# Patient Record
Sex: Female | Born: 1990 | Race: White | Hispanic: No | Marital: Single | Smoking: Current every day smoker
Health system: Southern US, Community
[De-identification: ages and names within clinical notes are randomized; demographics above are authoritative.]

## PROBLEM LIST (undated history)

## (undated) ENCOUNTER — Inpatient Hospital Stay: Payer: Self-pay

## (undated) DIAGNOSIS — G43909 Migraine, unspecified, not intractable, without status migrainosus: Secondary | ICD-10-CM

## (undated) HISTORY — DX: Migraine, unspecified, not intractable, without status migrainosus: G43.909

---
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
## 2010-07-30 ENCOUNTER — Observation Stay: Payer: Self-pay | Admitting: Obstetrics and Gynecology

## 2010-08-05 ENCOUNTER — Observation Stay: Payer: Self-pay | Admitting: Obstetrics and Gynecology

## 2010-08-08 ENCOUNTER — Inpatient Hospital Stay: Payer: Self-pay

## 2013-04-26 ENCOUNTER — Emergency Department: Payer: Self-pay | Admitting: Emergency Medicine

## 2013-05-29 ENCOUNTER — Emergency Department: Payer: Self-pay | Admitting: Emergency Medicine

## 2013-07-17 ENCOUNTER — Emergency Department: Payer: Self-pay | Admitting: Emergency Medicine

## 2013-07-17 LAB — RAPID INFLUENZA A&B ANTIGENS

## 2013-08-24 ENCOUNTER — Emergency Department: Payer: Self-pay | Admitting: Emergency Medicine

## 2013-08-24 LAB — CBC
HCT: 38.9 % (ref 35.0–47.0)
HGB: 13.3 g/dL (ref 12.0–16.0)
MCH: 30.2 pg (ref 26.0–34.0)
MCHC: 34.1 g/dL (ref 32.0–36.0)
MCV: 89 fL (ref 80–100)
Platelet: 225 10*3/uL (ref 150–440)
RBC: 4.39 10*6/uL (ref 3.80–5.20)
RDW: 15.3 % — ABNORMAL HIGH (ref 11.5–14.5)
WBC: 4.9 10*3/uL (ref 3.6–11.0)

## 2013-08-24 LAB — COMPREHENSIVE METABOLIC PANEL
ALBUMIN: 3.9 g/dL (ref 3.4–5.0)
ALT: 16 U/L (ref 12–78)
Alkaline Phosphatase: 74 U/L
Anion Gap: 3 — ABNORMAL LOW (ref 7–16)
BUN: 13 mg/dL (ref 7–18)
Bilirubin,Total: 0.3 mg/dL (ref 0.2–1.0)
CHLORIDE: 103 mmol/L (ref 98–107)
CO2: 28 mmol/L (ref 21–32)
CREATININE: 0.75 mg/dL (ref 0.60–1.30)
Calcium, Total: 8.8 mg/dL (ref 8.5–10.1)
Glucose: 94 mg/dL (ref 65–99)
OSMOLALITY: 268 (ref 275–301)
POTASSIUM: 3.4 mmol/L — AB (ref 3.5–5.1)
SGOT(AST): 25 U/L (ref 15–37)
Sodium: 134 mmol/L — ABNORMAL LOW (ref 136–145)
Total Protein: 8 g/dL (ref 6.4–8.2)

## 2013-08-24 LAB — GC/CHLAMYDIA PROBE AMP

## 2013-08-24 LAB — URINALYSIS, COMPLETE
BILIRUBIN, UR: NEGATIVE
Bacteria: NONE SEEN
Blood: NEGATIVE
Glucose,UR: NEGATIVE mg/dL (ref 0–75)
Ketone: NEGATIVE
NITRITE: NEGATIVE
Ph: 5 (ref 4.5–8.0)
Protein: NEGATIVE
SPECIFIC GRAVITY: 1.012 (ref 1.003–1.030)
WBC UR: 2 /HPF (ref 0–5)

## 2013-08-24 LAB — WET PREP, GENITAL

## 2013-08-24 LAB — PREGNANCY, URINE: Pregnancy Test, Urine: NEGATIVE m[IU]/mL

## 2013-11-29 ENCOUNTER — Emergency Department: Payer: Self-pay | Admitting: Emergency Medicine

## 2013-11-29 LAB — URINALYSIS, COMPLETE
Bacteria: NONE SEEN
Bilirubin,UR: NEGATIVE
Glucose,UR: NEGATIVE mg/dL (ref 0–75)
Nitrite: NEGATIVE
Ph: 5 (ref 4.5–8.0)
Protein: NEGATIVE
Specific Gravity: 1.017 (ref 1.003–1.030)
Squamous Epithelial: 14
WBC UR: 4 /HPF (ref 0–5)

## 2013-11-29 LAB — HCG, QUANTITATIVE, PREGNANCY: Beta Hcg, Quant.: 1142 m[IU]/mL — ABNORMAL HIGH

## 2013-11-29 LAB — MONONUCLEOSIS SCREEN: Mono Test: POSITIVE

## 2013-11-30 ENCOUNTER — Emergency Department: Payer: Self-pay | Admitting: Emergency Medicine

## 2013-11-30 LAB — HCG, QUANTITATIVE, PREGNANCY: Beta Hcg, Quant.: 1421 m[IU]/mL — ABNORMAL HIGH

## 2013-12-03 LAB — BETA STREP CULTURE(ARMC)

## 2013-12-08 ENCOUNTER — Emergency Department: Payer: Self-pay | Admitting: Emergency Medicine

## 2013-12-08 LAB — HCG, QUANTITATIVE, PREGNANCY: Beta Hcg, Quant.: 3000 m[IU]/mL — ABNORMAL HIGH

## 2013-12-08 LAB — CBC
HCT: 35.5 % (ref 35.0–47.0)
HGB: 12 g/dL (ref 12.0–16.0)
MCH: 29.2 pg (ref 26.0–34.0)
MCHC: 33.7 g/dL (ref 32.0–36.0)
MCV: 87 fL (ref 80–100)
PLATELETS: 327 10*3/uL (ref 150–440)
RBC: 4.09 10*6/uL (ref 3.80–5.20)
RDW: 14.7 % — ABNORMAL HIGH (ref 11.5–14.5)
WBC: 8 10*3/uL (ref 3.6–11.0)

## 2013-12-18 DIAGNOSIS — O039 Complete or unspecified spontaneous abortion without complication: Secondary | ICD-10-CM

## 2013-12-18 HISTORY — DX: Complete or unspecified spontaneous abortion without complication: O03.9

## 2014-06-05 ENCOUNTER — Emergency Department: Payer: Self-pay | Admitting: Emergency Medicine

## 2014-07-20 NOTE — L&D Delivery Note (Signed)
Obstetrical Delivery Note   Date of Delivery:   12/14/2014 Primary OB:   [facility] Gestational Age/EDD: [id number]  Antepartum complications: none  Delivered By:   Cornelia Copaharlie Kahla Risdon, Jr MD  Delivery Type:   spontaneous vaginal delivery  Procedure Details:   CTSP after epidural placed and feeling pressure. Complete/+2 and DOA. Over several UCs and pushes, patient delivered vigorous infant w/o complicated. Delayed CC and cord cut by FOB. Placenta out via active third stage (intact) and mom and baby doing well Anesthesia:    epidural Intrapartum complications: None GBS:    Negative Laceration:    none Episiotomy:    none Placenta:    Via active 3rd stage. To pathology: no Estimated Blood Loss:  200mL  Baby:    Liveborn female, Apgars 8/8, weight 3250gm  Cornelia Copaharlie Denee Boeder, Jr. MD [facility] Pager [phone]

## 2014-09-26 LAB — OB RESULTS CONSOLE RPR: RPR: NONREACTIVE

## 2014-09-26 LAB — OB RESULTS CONSOLE GC/CHLAMYDIA
CHLAMYDIA, DNA PROBE: NEGATIVE
Gonorrhea: NEGATIVE

## 2014-09-26 LAB — OB RESULTS CONSOLE ABO/RH: RH TYPE: POSITIVE

## 2014-09-26 LAB — OB RESULTS CONSOLE HIV ANTIBODY (ROUTINE TESTING): HIV: NONREACTIVE

## 2014-09-26 LAB — OB RESULTS CONSOLE ANTIBODY SCREEN: ANTIBODY SCREEN: NEGATIVE

## 2014-12-03 ENCOUNTER — Observation Stay
Admission: EM | Admit: 2014-12-03 | Discharge: 2014-12-03 | Disposition: A | Discharge status: Home / Self Care | Payer: Medicaid Other | Attending: Certified Nurse Midwife | Admitting: Certified Nurse Midwife

## 2014-12-03 DIAGNOSIS — Z3A38 38 weeks gestation of pregnancy: Secondary | ICD-10-CM | POA: Insufficient documentation

## 2014-12-03 DIAGNOSIS — G43909 Migraine, unspecified, not intractable, without status migrainosus: Secondary | ICD-10-CM | POA: Insufficient documentation

## 2014-12-03 DIAGNOSIS — Z87891 Personal history of nicotine dependence: Secondary | ICD-10-CM | POA: Diagnosis not present

## 2014-12-03 DIAGNOSIS — R102 Pelvic and perineal pain: Secondary | ICD-10-CM | POA: Diagnosis present

## 2014-12-03 DIAGNOSIS — O26893 Other specified pregnancy related conditions, third trimester: Principal | ICD-10-CM | POA: Insufficient documentation

## 2014-12-03 DIAGNOSIS — Z3490 Encounter for supervision of normal pregnancy, unspecified, unspecified trimester: Secondary | ICD-10-CM

## 2014-12-03 NOTE — Discharge Instructions (Signed)

## 2014-12-03 NOTE — OB Triage Note (Signed)
Patient with complaint of vaginal discharge and pressure, denies bleeding and contractions

## 2014-12-03 NOTE — Progress Notes (Signed)
24 year old G4 P2012 with EDC=12/16/2014 by LMP=03/11/2014 and c/w a 13 wk 4day ultrasound  presents at 3938 1/7 weeks with c/c of pelvic pressure and increased vaginal discharge since yesterday. Baby active. Not feeling any regular ctxs. No VB or LOF. Denies vulvovaginal itching or irritation. [facility]ra[facility]. Quit smoking when she had +UPT.  O POS, RI, VI, GBS negative  O: BP 118/66 mmHg  Pulse 81  Temp(Src) 98.3 F (36.8 C) (Oral)  Resp 16  Ht 5\' 2"  (1.575 m)  Wt 68.04 kg (150 lb)  BMI 27.43 kg/m2  LMP 03/11/2014   Abdomen: contraction palpated after performing  Leopold's. Cephalic. EFW=6 1/2# FHR 130 baseline with accels to 160s to 170s Some uterine activity (q3-12 min aprt).  Mostly uterine irritability EXT : vestibule appears red Vagina; white discharge present Wet prep: negative for clue cells, Trich, hyphae Cervix: 1/50%/-1  A: IUP at 38 1/7 weeks with no evidence of labor. Pressure due to low station of fetus Normal vaginal discharge  P: DC home Labor precautions  Dastan Krider, CNM

## 2014-12-13 ENCOUNTER — Observation Stay
Admission: EM | Admit: 2014-12-13 | Discharge: 2014-12-14 | Disposition: A | Discharge status: Home / Self Care | Payer: Medicaid Other | Source: Home / Self Care | Admitting: Obstetrics and Gynecology

## 2014-12-13 MED ORDER — LACTATED RINGERS IV SOLN
500.0000 mL | INTRAVENOUS | Status: DC | PRN
Start: 2014-12-13 — End: 2014-12-14

## 2014-12-13 NOTE — OB Triage Note (Signed)
Patient presents to L&D c/o contractions that began at 1400 and are now 10 minutes apart.  States she has lost mucous plug.  Denies bleeding, SROM or decreased fetal movement.  States membranes [facility] "stri[facility]" [facility]

## 2014-12-14 ENCOUNTER — Inpatient Hospital Stay: Payer: Medicaid Other | Admitting: Anesthesiology

## 2014-12-14 ENCOUNTER — Telehealth: Payer: Self-pay

## 2014-12-14 ENCOUNTER — Inpatient Hospital Stay
Admission: EM | Admit: 2014-12-14 | Discharge: 2014-12-16 | DRG: 775 | Disposition: A | Discharge status: Home / Self Care | Payer: Medicaid Other | Source: Ambulatory Visit | Attending: Obstetrics & Gynecology | Admitting: Obstetrics & Gynecology

## 2014-12-14 ENCOUNTER — Emergency Department
Admission: EM | Admit: 2014-12-14 | Discharge: 2014-12-14 | Discharge status: Absent without leave | Payer: Medicaid Other

## 2014-12-14 DIAGNOSIS — IMO0001 Reserved for inherently not codable concepts without codable children: Secondary | ICD-10-CM

## 2014-12-14 DIAGNOSIS — Z87891 Personal history of nicotine dependence: Secondary | ICD-10-CM | POA: Diagnosis not present

## 2014-12-14 DIAGNOSIS — Z3A39 39 weeks gestation of pregnancy: Secondary | ICD-10-CM | POA: Diagnosis present

## 2014-12-14 LAB — CBC
HCT: 29.7 % — ABNORMAL LOW (ref 35.0–47.0)
HEMOGLOBIN: 9.6 g/dL — AB (ref 12.0–16.0)
MCH: 25 pg — ABNORMAL LOW (ref 26.0–34.0)
MCHC: 32.4 g/dL (ref 32.0–36.0)
MCV: 77 fL — AB (ref 80.0–100.0)
Platelets: 175 10*3/uL (ref 150–440)
RBC: 3.86 MIL/uL (ref 3.80–5.20)
RDW: 17 % — ABNORMAL HIGH (ref 11.5–14.5)
WBC: 14 10*3/uL — AB (ref 3.6–11.0)

## 2014-12-14 MED ORDER — BUTORPHANOL TARTRATE 1 MG/ML IJ SOLN
INTRAMUSCULAR | Status: AC
  Filled 2014-12-14: qty 2

## 2014-12-14 MED ORDER — BUTORPHANOL TARTRATE 1 MG/ML IJ SOLN
1.0000 mg | INTRAMUSCULAR | Status: DC | PRN
  Administered 2014-12-14: 1 mg via INTRAVENOUS

## 2014-12-14 MED ORDER — LIDOCAINE-EPINEPHRINE (PF) 1.5 %-1:200000 IJ SOLN
INTRAMUSCULAR | Status: DC | PRN
  Administered 2014-12-14: 2 mL via PERINEURAL

## 2014-12-14 MED ORDER — CITRIC ACID-SODIUM CITRATE 334-500 MG/5ML PO SOLN: 30.0000 mL | ORAL | Status: DC | PRN

## 2014-12-14 MED ORDER — ACETAMINOPHEN 325 MG PO TABS: 650.0000 mg | ORAL_TABLET | ORAL | Status: DC | PRN

## 2014-12-14 MED ORDER — LACTATED RINGERS IV SOLN
INTRAVENOUS | Status: DC
  Administered 2014-12-14: 17:00:00 via INTRAVENOUS

## 2014-12-14 MED ORDER — AMMONIA AROMATIC IN INHA
RESPIRATORY_TRACT | Status: AC
  Filled 2014-12-14: qty 10

## 2014-12-14 MED ORDER — DIBUCAINE 1 % RE OINT: 1.0000 "application " | TOPICAL_OINTMENT | RECTAL | Status: DC | PRN

## 2014-12-14 MED ORDER — LANOLIN HYDROUS EX OINT: TOPICAL_OINTMENT | CUTANEOUS | Status: DC | PRN

## 2014-12-14 MED ORDER — LACTATED RINGERS IV SOLN: 500.0000 mL | INTRAVENOUS | Status: DC | PRN

## 2014-12-14 MED ORDER — OXYTOCIN 40 UNITS IN LACTATED RINGERS INFUSION - SIMPLE MED
INTRAVENOUS | Status: AC
  Filled 2014-12-14: qty 1000

## 2014-12-14 MED ORDER — SIMETHICONE 80 MG PO CHEW
80.0000 mg | CHEWABLE_TABLET | ORAL | Status: DC | PRN
  Filled 2014-12-14: qty 1

## 2014-12-14 MED ORDER — BUPIVACAINE HCL (PF) 0.25 % IJ SOLN
INTRAMUSCULAR | Status: DC | PRN
  Administered 2014-12-14: 2 mL via PERINEURAL
  Administered 2014-12-14: 5 mL via PERINEURAL

## 2014-12-14 MED ORDER — BUTORPHANOL TARTRATE 1 MG/ML IJ SOLN
INTRAMUSCULAR | Status: AC
  Administered 2014-12-14: 1 mg via INTRAVENOUS
  Filled 2014-12-14: qty 2

## 2014-12-14 MED ORDER — BENZOCAINE-MENTHOL 20-0.5 % EX AERO: 1.0000 "application " | INHALATION_SPRAY | CUTANEOUS | Status: DC | PRN

## 2014-12-14 MED ORDER — ZOLPIDEM TARTRATE 5 MG PO TABS: 5.0000 mg | ORAL_TABLET | Freq: Every evening | ORAL | Status: DC | PRN

## 2014-12-14 MED ORDER — MAGNESIUM HYDROXIDE 400 MG/5ML PO SUSP: 30.0000 mL | ORAL | Status: DC | PRN

## 2014-12-14 MED ORDER — FENTANYL CITRATE (PF) 100 MCG/2ML IJ SOLN: 50.0000 ug | INTRAMUSCULAR | Status: DC | PRN

## 2014-12-14 MED ORDER — PRENATAL MULTIVITAMIN CH
1.0000 | ORAL_TABLET | Freq: Every day | ORAL | Status: DC
  Administered 2014-12-15 – 2014-12-16 (×2): 1 via ORAL
  Filled 2014-12-14 (×3): qty 1

## 2014-12-14 MED ORDER — MISOPROSTOL 200 MCG PO TABS
ORAL_TABLET | ORAL | Status: AC
  Filled 2014-12-14: qty 4

## 2014-12-14 MED ORDER — FENTANYL 2.5 MCG/ML W/ROPIVACAINE 0.2% IN NS 100 ML EPIDURAL INFUSION (ARMC-ANES)
EPIDURAL | Status: DC
Start: 2014-12-14 — End: 2014-12-14
  Filled 2014-12-14: qty 100

## 2014-12-14 MED ORDER — WITCH HAZEL-GLYCERIN EX PADS: 1.0000 "application " | MEDICATED_PAD | CUTANEOUS | Status: DC | PRN

## 2014-12-14 MED ORDER — OXYCODONE-ACETAMINOPHEN 5-325 MG PO TABS: 1.0000 | ORAL_TABLET | ORAL | Status: DC | PRN

## 2014-12-14 MED ORDER — ONDANSETRON HCL 4 MG PO TABS
4.0000 mg | ORAL_TABLET | ORAL | Status: DC | PRN
  Filled 2014-12-14: qty 1

## 2014-12-14 MED ORDER — SODIUM CHLORIDE 0.9 % IV SOLN: 250.0000 mL | INTRAVENOUS | Status: DC | PRN

## 2014-12-14 MED ORDER — SODIUM CHLORIDE 0.9 % IJ SOLN: 3.0000 mL | Freq: Two times a day (BID) | INTRAMUSCULAR | Status: DC

## 2014-12-14 MED ORDER — LIDOCAINE HCL (PF) 1 % IJ SOLN
30.0000 mL | INTRAMUSCULAR | Status: DC | PRN
  Filled 2014-12-14: qty 30

## 2014-12-14 MED ORDER — BUTORPHANOL TARTRATE 1 MG/ML IJ SOLN
2.0000 mg | Freq: Once | INTRAMUSCULAR | Status: AC
  Administered 2014-12-14: 2 mg via INTRAVENOUS

## 2014-12-14 MED ORDER — OXYTOCIN BOLUS FROM INFUSION: 500.0000 mL | INTRAVENOUS | Status: DC

## 2014-12-14 MED ORDER — OXYTOCIN 40 UNITS IN LACTATED RINGERS INFUSION - SIMPLE MED: 62.5000 mL/h | INTRAVENOUS | Status: DC

## 2014-12-14 MED ORDER — LIDOCAINE HCL (PF) 1 % IJ SOLN
INTRAMUSCULAR | Status: AC
  Filled 2014-12-14: qty 30

## 2014-12-14 MED ORDER — IBUPROFEN 600 MG PO TABS
600.0000 mg | ORAL_TABLET | Freq: Four times a day (QID) | ORAL | Status: DC
  Administered 2014-12-15: 600 mg via ORAL
  Filled 2014-12-14 (×3): qty 1

## 2014-12-14 MED ORDER — ONDANSETRON HCL 4 MG/2ML IJ SOLN: 4.0000 mg | INTRAMUSCULAR | Status: DC | PRN

## 2014-12-14 MED ORDER — OXYTOCIN 10 UNIT/ML IJ SOLN
INTRAMUSCULAR | Status: AC
  Filled 2014-12-14: qty 2

## 2014-12-14 MED ORDER — LACTATED RINGERS IV SOLN: INTRAVENOUS | Status: DC

## 2014-12-14 MED ORDER — DIPHENHYDRAMINE HCL 25 MG PO CAPS: 25.0000 mg | ORAL_CAPSULE | Freq: Four times a day (QID) | ORAL | Status: DC | PRN

## 2014-12-14 MED ORDER — DOCUSATE SODIUM 100 MG PO CAPS
100.0000 mg | ORAL_CAPSULE | Freq: Two times a day (BID) | ORAL | Status: DC
  Administered 2014-12-15 – 2014-12-16 (×2): 100 mg via ORAL
  Filled 2014-12-14 (×2): qty 1

## 2014-12-14 MED ORDER — SODIUM CHLORIDE 0.9 % IJ SOLN: 3.0000 mL | INTRAMUSCULAR | Status: DC | PRN

## 2014-12-14 MED ORDER — ONDANSETRON HCL 4 MG/2ML IJ SOLN: 4.0000 mg | Freq: Four times a day (QID) | INTRAMUSCULAR | Status: DC | PRN

## 2014-12-14 NOTE — Anesthesia Procedure Notes (Signed)
Epidural Patient location during procedure: OB Start time: 12/14/2014 6:45 PM  Staffing Anesthesiologist: Elijio MilesVAN STAVEREN, Lorain Fettes F Performed by: anesthesiologist   Preanesthetic Checklist Completed: patient identified, site marked, surgical consent, pre-op evaluation, timeout performed, IV checked, risks and benefits discussed, monitors and equipment checked and at surgeon's request  Epidural Patient position: sitting Prep: Betadine Patient monitoring: heart rate and blood pressure Approach: midline Location: L3-L4 Injection technique: LOR air and LOR saline  Needle:  Needle type: Tuohy  Needle gauge: 18 G Needle length: 9 cm Needle insertion depth: 5 cm Catheter type: closed end flexible Catheter size: 20 Guage Catheter at skin depth: 9 cm Test dose: negative and 1.5% lidocaine with Epi 1:200 K  Assessment Sensory level: T10  Additional Notes Reason for block:at surgeon's request

## 2014-12-14 NOTE — Anesthesia Preprocedure Evaluation (Signed)
Anesthesia Evaluation  Patient identified by MRN, date of birth, ID band Patient awake    Reviewed: Allergy & Precautions, NPO status   History of Anesthesia Complications Negative for: history of anesthetic complications  Airway Mallampati: II       Dental   Pulmonary former smoker,    Pulmonary exam normal       Cardiovascular negative cardio ROS Normal cardiovascular exam    Neuro/Psych negative neurological ROS  negative psych ROS   GI/Hepatic negative GI ROS, Neg liver ROS,   Endo/Other  negative endocrine ROS  Renal/GU negative Renal ROS     Musculoskeletal negative musculoskeletal ROS (+)   Abdominal Normal abdominal exam  (+)   Peds negative pediatric ROS (+)  Hematology negative hematology ROS (+)   Anesthesia Other Findings   Reproductive/Obstetrics                             Anesthesia Physical Anesthesia Plan  ASA: II  Anesthesia Plan: Epidural   Post-op Pain Management:    Induction:   Airway Management Planned: Natural Airway  Additional Equipment:   Intra-op Plan:   Post-operative Plan:   Informed Consent: I have reviewed the patients History and Physical, chart, labs and discussed the procedure including the risks, benefits and alternatives for the proposed anesthesia with the patient or authorized representative who has indicated his/her understanding and acceptance.     Plan Discussed with:   Anesthesia Plan Comments:         Anesthesia Quick Evaluation

## 2014-12-14 NOTE — Discharge Summary (Signed)
Obstetrical Discharge Summary  Date of Admission: 12/14/2014 Date of Discharge: 12/16/2014  Primary OB: [facility]  Gestational Age at Delivery: 7029w5d   Antepartum complications: none Reason for Admission: Active labor Date of Delivery: 12/14/2014  Delivered By: Cornelia Copaharlie Pickens, Jr MD Delivery Type: spontaneous vaginal delivery Intrapartum complications/course: None Anesthesia: epidural Placenta: expressed, intact Laceration: none Episiotomy: none Baby: Liveborn female  Post partum course: Routine Disposition: Home  Rh Immune globulin given: not applicable Rubella vaccine given: not applicable Tdap vaccine given in AP or PP setting: given in pregnancy Flu vaccine given in AP or PP setting: not applicable  Contraception: Plans IUD  Prenatal/Postnatal Panel: O POS//Rubella Immune//RPR negative//HIV negative/HepB Surface Ag negative//pap no abnormalities //plans to breastfeed  Plan:  Katherine Fitzgerald was discharged to home in good condition. Follow-up appointment with [facility] in 6 weeks   Discharge Medications:   Medication List    TAKE these medications        ibuprofen 600 MG tablet  Commonly known as:  ADVIL,MOTRIN  Take 1 tablet (600 mg total) by mouth every 6 (six) hours.     PRENATAL VITAMIN PO  Take 1 tablet by mouth every morning.        [facility]

## 2014-12-14 NOTE — H&P (Signed)
Katherine Fitzgerald is a 24 y.o. female [phone] @ 3039.4 presenting for labor at term. History OB History    Gravida Para Term Preterm AB TAB SAB Ectopic Multiple Living   4 2 2  1  1   2      No past medical history on file. No past surgical history on file. Family History: family history is not on file. Social History:  reports that she quit smoking about 3 months ago. She has never used smokeless tobacco. She reports that she does not drink alcohol or use illicit drugs.   Prenatal Transfer Tool  Maternal Diabetes: No Genetic Screening: Declined Maternal Ultrasounds/Referrals: Normal Fetal Ultrasounds or other Referrals:  None Maternal Substance Abuse:  No Significant Maternal Medications:  None Significant Maternal Lab Results:  None Other Comments:  None  ROS    Last menstrual period 03/11/2014. Exam Physical Exam  Prenatal labs: ABO, Rh:  O+ Antibody:  neg Rubella:  immune RPR:   non-reactive HBsAg:   negative HIV:   negative GBS:   negative Tdap UTD  Assessment/Plan: 23yo [id number] @ 39.4 in labor  1. Admit to L&D 2. Standard intrapartum orders 3. Pain control as needed 4. Anticipate vaginal delivery, continue expectant management.   Ward, Storie C 12/14/2014, 4:28 PM

## 2014-12-15 LAB — ABO/RH: ABO/RH(D): O POS

## 2014-12-15 LAB — TYPE AND SCREEN
ABO/RH(D): O POS
Antibody Screen: NEGATIVE

## 2014-12-15 LAB — RPR: RPR Ser Ql: NONREACTIVE

## 2014-12-15 NOTE — Progress Notes (Signed)
[name]: no complaints, up ad lib, voiding and tolerating PO  Objective: Blood pressure 109/62, pulse 78, temperature 97.3 F (36.3 C), temperature source Oral, resp. rate 16, last menstrual period 03/11/2014, SpO2 100 %, unknown if currently breastfeeding.  Physical Exam:  General: alert, cooperative and appears stated age Lochia: appropriate Uterine Fundus: firm Incision: none DVT Evaluation: No evidence of DVT seen on physical exam.   Recent Labs  12/14/14 1657  HGB 9.6*  HCT 29.7*    Assessment/Plan: Plan for discharge tomorrow, Breastfeeding and Contraception - plans IUD   LOS: 1 day   Katherine Fitzgerald Pfannenstiel PAUL 12/15/2014, 2:43 PM

## 2014-12-15 NOTE — Anesthesia Postprocedure Evaluation (Signed)
  Anesthesia Post-op Note  Patient: Katherine Fitzgerald  Procedure(s) Performed: CLE  Anesthesia type: CLE  Patient location: PACU  Post pain: Pain level controlled  Post assessment: Post-op Vital signs reviewed, Patient's Cardiovascular Status Stable, Respiratory Function Stable, Patent Airway and No signs of Nausea or vomiting  Post vital signs: Reviewed and stable  Last Vitals:  Filed Vitals:   12/15/14 0845  BP: 106/64  Pulse: 68  Temp: 36.5 C  Resp:     Level of consciousness: awake, alert  and patient cooperative  Complications: No apparent anesthesia complications

## 2014-12-16 LAB — HIV ANTIBODY (ROUTINE TESTING W REFLEX): HIV Screen 4th Generation wRfx: NONREACTIVE

## 2014-12-16 MED ORDER — IBUPROFEN 600 MG PO TABS: 600.0000 mg | ORAL_TABLET | Freq: Four times a day (QID) | ORAL | Status: DC

## 2014-12-16 NOTE — Progress Notes (Signed)
Discharge orders in by Dr. Cherie OuchNogo. Instructions reviewed with mother. Mother v/u of all instructions. ID bands of mom and infant matched. Escorted by nursing via w/c in stable condition, infant in mother's arms.

## 2014-12-16 NOTE — Discharge Instructions (Signed)
Postpartum Care After Vaginal Delivery °After you deliver your newborn (postpartum period), the usual stay in the hospital is 24-72 hours. If there were problems with your labor or delivery, or if you have other medical problems, you might be in the hospital longer.  °While you are in the hospital, you will receive help and instructions on how to care for yourself and your newborn during the postpartum period.  °While you are in the hospital: °· Be sure to tell your nurses if you have pain or discomfort, as well as where you feel the pain and what makes the pain worse. °· If you had an incision made near your vagina (episiotomy) or if you had some tearing during delivery, the nurses may put ice packs on your episiotomy or tear. The ice packs may help to reduce the pain and swelling. °· If you are breastfeeding, you may feel uncomfortable contractions of your uterus for a couple of weeks. This is normal. The contractions help your uterus get back to normal size. °· It is normal to have some bleeding after delivery. °¨ For the first 1-3 days after delivery, the flow is red and the amount may be similar to a period. °¨ It is common for the flow to start and stop. °¨ In the first few days, you may pass some small clots. Let your nurses know if you begin to pass large clots or your flow increases. °¨ Do not  flush blood clots down the toilet before having the nurse look at them. °¨ During the next 3-10 days after delivery, your flow should become more watery and pink or brown-tinged in color. °¨ Ten to fourteen days after delivery, your flow should be a small amount of yellowish-white discharge. °¨ The amount of your flow will decrease over the first few weeks after delivery. Your flow may stop in 6-8 weeks. Most women have had their flow stop by 12 weeks after delivery. °· You should change your sanitary pads frequently. °· Wash your hands thoroughly with soap and water for at least 20 seconds after changing pads, using  the toilet, or before holding or feeding your newborn. °· You should feel like you need to empty your bladder within the first 6-8 hours after delivery. °· In case you become weak, lightheaded, or faint, call your nurse before you get out of bed for the first time and before you take a shower for the first time. °· Within the first few days after delivery, your breasts may begin to feel tender and full. This is called engorgement. Breast tenderness usually goes away within 48-72 hours after engorgement occurs. You may also notice milk leaking from your breasts. If you are not breastfeeding, do not stimulate your breasts. Breast stimulation can make your breasts produce more milk. °· Spending as much time as possible with your newborn is very important. During this time, you and your newborn can feel close and get to know each other. Having your newborn stay in your room (rooming in) will help to strengthen the bond with your newborn.  It will give you time to get to know your newborn and become comfortable caring for your newborn. °· Your hormones change after delivery. Sometimes the hormone changes can temporarily cause you to feel sad or tearful. These feelings should not last more than a few days. If these feelings last longer than that, you should talk to your caregiver. °· If desired, talk to your caregiver about methods of family planning or contraception. °·   Talk to your caregiver about immunizations. Your caregiver may want you to have the following immunizations before leaving the hospital: °¨ Tetanus, diphtheria, and pertussis (Tdap) or tetanus and diphtheria (Td) immunization. It is very important that you and your family (including grandparents) or others caring for your newborn are up-to-date with the Tdap or Td immunizations. The Tdap or Td immunization can help protect your newborn from getting ill. °¨ Rubella immunization. °¨ Varicella (chickenpox) immunization. °¨ Influenza immunization. You should  receive this annual immunization if you did not receive the immunization during your pregnancy. °Document Released: 05/03/2007 Document Revised: 03/30/2012 Document Reviewed: 03/02/2012 °ExitCare® Patient Information ©2015 [employer]. This information is not intended to replace advice given to you by your health care provider. Make sure you discuss any questions you have with your health care provider. ° ° °No intercourse for 6 weeks, no tampons, douching or enemas. No heavy lifting, nothing heavier than the baby. No driving for 2 weeks. Call your doctor for fever >100.4, increase in pain, heavy bleeding (changing your pad every hour or passing clots the size of your palm); for chest pain, blurred vision, seeing spots, bad headache that won't go away come to the ER.  °

## 2014-12-16 NOTE — Progress Notes (Signed)
Post Partum Day 2 Subjective: no complaints, up ad lib, voiding and tolerating PO  Objective: Blood pressure 112/81, pulse 66, temperature 98.2 F (36.8 C), temperature source Oral, resp. rate 18, last menstrual period 03/11/2014, SpO2 100 %, unknown if currently breastfeeding.  Physical Exam:  General: alert, cooperative and appears stated age Lochia: appropriate Uterine Fundus: firm Incision: none DVT Evaluation: No evidence of DVT seen on physical exam.   Recent Labs  12/14/14 1657  HGB 9.6*  HCT 29.7*    Assessment/Plan: Plan for tomorrow, Breastfeeding and Contraception - plans IUD   LOS: 2 days   Katherine Fitzgerald 12/16/2014, 11:13 AM

## 2015-01-25 LAB — HM PAP SMEAR

## 2017-05-20 ENCOUNTER — Encounter: Payer: Self-pay | Admitting: Emergency Medicine

## 2017-05-20 ENCOUNTER — Emergency Department: Payer: Self-pay

## 2017-05-20 ENCOUNTER — Emergency Department
Admission: EM | Admit: 2017-05-20 | Discharge: 2017-05-20 | Disposition: A | Discharge status: Home / Self Care | Payer: Self-pay | Attending: Emergency Medicine | Admitting: Emergency Medicine

## 2017-05-20 DIAGNOSIS — J209 Acute bronchitis, unspecified: Secondary | ICD-10-CM | POA: Insufficient documentation

## 2017-05-20 DIAGNOSIS — Z79899 Other long term (current) drug therapy: Secondary | ICD-10-CM | POA: Insufficient documentation

## 2017-05-20 DIAGNOSIS — J40 Bronchitis, not specified as acute or chronic: Secondary | ICD-10-CM

## 2017-05-20 DIAGNOSIS — Z87891 Personal history of nicotine dependence: Secondary | ICD-10-CM | POA: Insufficient documentation

## 2017-05-20 MED ORDER — PREDNISONE 10 MG PO TABS: ORAL_TABLET | ORAL | 0 refills | Status: DC

## 2017-05-20 MED ORDER — AZITHROMYCIN 250 MG PO TABS: ORAL_TABLET | ORAL | 0 refills | Status: DC

## 2017-05-20 MED ORDER — BENZONATATE 100 MG PO CAPS: 100.0000 mg | ORAL_CAPSULE | Freq: Three times a day (TID) | ORAL | 0 refills | Status: AC | PRN

## 2017-05-20 MED ORDER — IPRATROPIUM-ALBUTEROL 0.5-2.5 (3) MG/3ML IN SOLN
3.0000 mL | Freq: Once | RESPIRATORY_TRACT | Status: AC
  Administered 2017-05-20: 3 mL via RESPIRATORY_TRACT
  Filled 2017-05-20: qty 3

## 2017-05-20 NOTE — ED Triage Notes (Signed)
Pt c/o cough X 2 weeks. 2 days ago started with subjective fever (husband told her she felt like she had one), sore throat, body aches.  Ambulatory. No respiratory distress.

## 2017-05-20 NOTE — ED Provider Notes (Signed)
[facility] Emergency Department Provider Note  ____________________________________________  Time seen: Approximately 2:49 PM  I have reviewed the triage vital signs and the nursing notes.   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 She has some nasal congestion and a mildly sore throat. No sick contacts. She smokes 3-4 cigarettes per day. No allergies or asthma. No fever, chills, SOB, CP, nausea, vomiting, abdominal pain.    History reviewed. No pertinent past medical history.  Patient Active Problem List   Diagnosis Date Noted  . Active labor at term 12/14/2014  . Labor and delivery, indication for care 12/13/2014  . Pregnancy 12/03/2014  . Indication for care in labor and delivery, antepartum 12/03/2014    History reviewed. No pertinent surgical history.  Prior to Admission medications   Medication Sig Start Date End Date Taking? Authorizing Provider  azithromycin (ZITHROMAX Z-PAK) 250 MG tablet Take 2 tablets (500 mg) on  Day 1,  followed by 1 tablet (250 mg) once daily on Days 2 through 5. 05/20/17   Enid DerryWagner, Mylin Hirano, PA-C  benzonatate (TESSALON PERLES) 100 MG capsule Take 1 capsule (100 mg total) by mouth 3 (three) times daily as needed for cough. 05/20/17 05/20/18  Enid DerryWagner, Jianni Batten, PA-C  ibuprofen (ADVIL,MOTRIN) 600 MG tablet Take 1 tablet (600 mg total) by mouth every 6 (six) hours. 12/16/14   Nadara MustardHarris, Robert P, MD  predniSONE (DELTASONE) 10 MG tablet Take 6 tablets on day 1, take 5 tablets on day 2, take 4 tablets on day 3, take 3 tablets on day 4, take 2 tablets on day 5, take 1 tablet on day 6 05/20/17   Enid DerryWagner, Naija Troost, PA-C  Prenatal Vit-Fe Fumarate-FA (PRENATAL VITAMIN PO) Take 1 tablet by mouth every morning.    [provider]    Allergies Patient has no known allergies.  History reviewed. No  pertinent family history.  Social History Social History  Substance Use Topics  . Smoking status: Former Smoker    Quit date: 09/04/2014  . Smokeless tobacco: Never Used  . Alcohol use No     Review of Systems  Constitutional: No fever/chills ENT: Positive for congestion and rhinorrhea. Cardiovascular: No chest pain. Respiratory: Positive for cough. No SOB. Gastrointestinal: No abdominal pain.  No nausea, no vomiting.  No diarrhea.  No constipation. Musculoskeletal: Negative for musculoskeletal pain. Skin: Negative for rash, abrasions, lacerations, ecchymosis. Neurological: Negative for headaches.   ____________________________________________   PHYSICAL EXAM:  VITAL SIGNS: ED Triage Vitals  Enc Vitals Group     BP 05/20/17 1337 (!) 114/40     Pulse Rate 05/20/17 1336 74     Resp 05/20/17 1336 16     Temp 05/20/17 1336 99 F (37.2 C)     Temp Source 05/20/17 1336 Oral     SpO2 05/20/17 1336 98 %     Weight 05/20/17 1336 139 lb (63 kg)     Height 05/20/17 1336 5\' 2"  (1.575 m)     Head Circumference --      Peak Flow --      Pain Score 05/20/17 1335 7     Pain Loc --      Pain Edu? --      Excl. in GC? --      Constitutional: Alert and oriented. Well appearing and in no acute distress. Eyes: Conjunctivae are normal.  PERRL. EOMI. No discharge. Head: Atraumatic. ENT: No frontal and maxillary sinus tenderness.      Ears: Tympanic membranes pearly gray with good landmarks. No discharge.      Nose: Mild congestion/rhinnorhea.      Mouth/Throat: Mucous membranes are moist. Oropharynx non-erythematous. Tonsils not enlarged. No exudates. Uvula midline. Neck: No stridor.   Hematological/Lymphatic/Immunilogical: No cervical lymphadenopathy. Cardiovascular: Normal rate, regular rhythm.  Good peripheral circulation. Respiratory: Normal respiratory effort without tachypnea or retractions. Lungs CTAB. Good air entry to the bases with no decreased or absent breath  sounds. Gastrointestinal: Bowel sounds 4 quadrants. Soft and nontender to palpation. No guarding or rigidity. No palpable masses. No distention. Musculoskeletal: Full range of motion to all extremities. No gross deformities appreciated. Neurologic:  Normal speech and language. No gross focal neurologic deficits are appreciated.  Skin:  Skin is warm, dry and intact. No rash noted.   ____________________________________________   LABS (all labs ordered are listed, but only abnormal results are displayed)  Labs Reviewed - No data to display ____________________________________________  EKG   ____________________________________________  RADIOLOGY Lexine Baton, personally viewed and evaluated these images (plain radiographs) as part of my medical decision making, as well as reviewing the written report by the radiologist.  Dg Chest 2 View  Result Date: 05/20/2017 CLINICAL DATA:  Dry cough.  Shortness of breath . EXAM: CHEST  2 VIEW COMPARISON:  No recent. FINDINGS: Mediastinum and hilar structures normal. Heart size normal. No focal infiltrate. Small left pleural effusion. No pneumothorax . IMPRESSION: No acute pulmonary disease.  Small left pleural effusion . Electronically Signed   By: Maisie Fus  Register   On: 05/20/2017 14:25    ____________________________________________    PROCEDURES  Procedure(s) performed:    Procedures    Medications  ipratropium-albuterol (DUONEB) 0.5-2.5 (3) MG/3ML nebulizer solution 3 mL (3 mLs Nebulization Given 05/20/17 1522)     ____________________________________________   INITIAL IMPRESSION / ASSESSMENT AND PLAN / ED COURSE  Pertinent labs & imaging results that were available during my care of the patient were reviewed by me and considered in my medical decision making (see chart for details).  Review of the [place] CSRS was performed in accordance of the [facility] prior to dispensing any controlled drugs.    Patient's diagnosis is  consistent with bronchitis. Vital signs and exam are reassuring. Chest x-ray consistent with small left pleural effusion. Patient will follow up with primary care for repeat chest x-ray to ensure resolution. Patient was given DuoNeb in ED. Patient feels comfortable going home. Patient will be discharged home with prescriptions for azithromycin, prednisone, Tessalon Perles. Patient is to follow up with PCP as needed or otherwise directed. Patient is given ED precautions to return to the ED for any worsening or new symptoms.     ____________________________________________  FINAL CLINICAL IMPRESSION(S) / ED DIAGNOSES  Final diagnoses:  Bronchitis      NEW MEDICATIONS STARTED DURING THIS VISIT:  Discharge Medication List as of 05/20/2017  3:31 PM    START taking these medications   Details  azithromycin (ZITHROMAX Z-PAK) 250 MG tablet Take 2 tablets (500 mg) on  Day 1,  followed by 1 tablet (250 mg) once daily on Days 2 through 5., Print    benzonatate (TESSALON PERLES) 100 MG capsule Take 1 capsule (100 mg total) by mouth 3 (three) times daily as needed for cough., Starting Thu 05/20/2017, Until Fri 05/20/2018, Print    predniSONE (DELTASONE) 10 MG tablet Take 6 tablets on day 1, take  5 tablets on day 2, take 4 tablets on day 3, take 3 tablets on day 4, take 2 tablets on day 5, take 1 tablet on day 6, Print            This chart was dictated using voice recognition software/Dragon. Despite best efforts to proofread, errors can occur which can change the meaning. Any change was purely unintentional.    Enid Derry, PA-C 05/20/17 1638    Phineas Semen, MD 05/21/17 1525

## 2018-02-09 ENCOUNTER — Encounter: Payer: Self-pay | Admitting: Emergency Medicine

## 2018-02-09 ENCOUNTER — Other Ambulatory Visit: Payer: Self-pay

## 2018-02-09 ENCOUNTER — Emergency Department
Admission: EM | Admit: 2018-02-09 | Discharge: 2018-02-09 | Disposition: A | Discharge status: Home / Self Care | Payer: Self-pay | Attending: Emergency Medicine | Admitting: Emergency Medicine

## 2018-02-09 ENCOUNTER — Emergency Department: Payer: Self-pay

## 2018-02-09 DIAGNOSIS — N3000 Acute cystitis without hematuria: Secondary | ICD-10-CM | POA: Insufficient documentation

## 2018-02-09 DIAGNOSIS — Z79899 Other long term (current) drug therapy: Secondary | ICD-10-CM | POA: Insufficient documentation

## 2018-02-09 DIAGNOSIS — Z87891 Personal history of nicotine dependence: Secondary | ICD-10-CM | POA: Insufficient documentation

## 2018-02-09 LAB — COMPREHENSIVE METABOLIC PANEL
ALBUMIN: 4.3 g/dL (ref 3.5–5.0)
ALK PHOS: 82 U/L (ref 38–126)
ALT: 13 U/L (ref 0–44)
AST: 18 U/L (ref 15–41)
Anion gap: 6 (ref 5–15)
BILIRUBIN TOTAL: 0.7 mg/dL (ref 0.3–1.2)
BUN: 6 mg/dL (ref 6–20)
CO2: 26 mmol/L (ref 22–32)
CREATININE: 0.72 mg/dL (ref 0.44–1.00)
Calcium: 9.2 mg/dL (ref 8.9–10.3)
Chloride: 106 mmol/L (ref 98–111)
GFR calc Af Amer: >60 mL/min (ref >60)
GFR calc non Af Amer: >60 mL/min (ref >60)
GLUCOSE: 99 mg/dL (ref 70–99)
Potassium: 3.3 mmol/L — ABNORMAL LOW (ref 3.5–5.1)
SODIUM: 138 mmol/L (ref 135–145)
TOTAL PROTEIN: 8 g/dL (ref 6.5–8.1)

## 2018-02-09 LAB — CBC WITH DIFFERENTIAL/PLATELET
BASOS PCT: 1 %
Basophils Absolute: 0.1 10*3/uL (ref 0–0.1)
EOS ABS: 0.1 10*3/uL (ref 0–0.7)
Eosinophils Relative: 0 %
HEMATOCRIT: 41.3 % (ref 35.0–47.0)
HEMOGLOBIN: 14.3 g/dL (ref 12.0–16.0)
LYMPHS ABS: 2.8 10*3/uL (ref 1.0–3.6)
Lymphocytes Relative: 19 %
MCH: 31.6 pg (ref 26.0–34.0)
MCHC: 34.6 g/dL (ref 32.0–36.0)
MCV: 91.3 fL (ref 80.0–100.0)
MONOS PCT: 7 %
Monocytes Absolute: 1 10*3/uL — ABNORMAL HIGH (ref 0.2–0.9)
NEUTROS ABS: 10.6 10*3/uL — AB (ref 1.4–6.5)
NEUTROS PCT: 73 %
Platelets: 303 10*3/uL (ref 150–440)
RBC: 4.52 MIL/uL (ref 3.80–5.20)
RDW: 12.9 % (ref 11.5–14.5)
WBC: 14.5 10*3/uL — AB (ref 3.6–11.0)

## 2018-02-09 LAB — URINALYSIS, COMPLETE (UACMP) WITH MICROSCOPIC
Bilirubin Urine: NEGATIVE
Glucose, UA: NEGATIVE mg/dL
Ketones, ur: NEGATIVE mg/dL
Nitrite: NEGATIVE
PROTEIN: 100 mg/dL — AB
SPECIFIC GRAVITY, URINE: 1.003 — AB (ref 1.005–1.030)
WBC, UA: >50 WBC/hpf — ABNORMAL HIGH (ref 0–5)
pH: 7 (ref 5.0–8.0)

## 2018-02-09 LAB — POCT PREGNANCY, URINE: PREG TEST UR: NEGATIVE

## 2018-02-09 MED ORDER — CEPHALEXIN 500 MG PO CAPS
500.0000 mg | ORAL_CAPSULE | Freq: Once | ORAL | Status: AC
  Administered 2018-02-09: 500 mg via ORAL
  Filled 2018-02-09: qty 1

## 2018-02-09 MED ORDER — CEPHALEXIN 500 MG PO CAPS: 500.0000 mg | ORAL_CAPSULE | Freq: Two times a day (BID) | ORAL | 0 refills | Status: AC

## 2018-02-09 MED ORDER — PHENAZOPYRIDINE HCL 100 MG PO TABS: 100.0000 mg | ORAL_TABLET | Freq: Three times a day (TID) | ORAL | 0 refills | Status: AC | PRN

## 2018-02-09 MED ORDER — KETOROLAC TROMETHAMINE 30 MG/ML IJ SOLN
30.0000 mg | Freq: Once | INTRAMUSCULAR | Status: AC
  Administered 2018-02-09: 30 mg via INTRAVENOUS
  Filled 2018-02-09: qty 1

## 2018-02-09 NOTE — ED Triage Notes (Signed)
Patient ambulatory to triage with steady gait, without difficulty or distress noted; pt reports x 2hrs having right lower abd pain radiating into flank with no accomp symptoms; denies hx of same

## 2018-02-09 NOTE — ED Notes (Signed)
Pt in with co right flank pain since 2200 yesterday, has had some urinary discomfort. Pt denies any n.v.d or fever.

## 2018-02-09 NOTE — ED Notes (Signed)
Patient discharged to home per MD order. Patient in stable condition, and deemed medically cleared by ED provider for discharge. Discharge instructions reviewed with patient/family using "Teach Back"; verbalized understanding of medication education and administration, and information about follow-up care. Denies further concerns. ° °

## 2018-02-09 NOTE — ED Provider Notes (Signed)
[facility] Emergency Department Provider Note   First MD Initiated Contact with Patient 02/09/18 0335     (approximate)  I have reviewed the triage vital signs and the nursing notes.   HISTORY  Chief Complaint Abdominal Pain   HPI Katherine Fitzgerald is a 27 y.o. female presents emergency department a 2-hour history of right lower abdominal discomfort as radiating to bilateral flank.  Patient admits to dysuria and urinary urgency.  Patient denies any fever.  Patient states current pain score 6 out of 10.   Past medical history  Patient Active Problem List   Diagnosis Date Noted  . Active labor at term 12/14/2014  . Labor and delivery, indication for care 12/13/2014  . Pregnancy 12/03/2014  . Indication for care in labor and delivery, antepartum 12/03/2014    History reviewed. No pertinent surgical history.  Prior to Admission medications   Medication Sig Start Date End Date Taking? Authorizing Provider  azithromycin (ZITHROMAX Z-PAK) 250 MG tablet Take 2 tablets (500 mg) on  Day 1,  followed by 1 tablet (250 mg) once daily on Days 2 through 5. 05/20/17   Enid Derry, PA-C  benzonatate (TESSALON PERLES) 100 MG capsule Take 1 capsule (100 mg total) by mouth 3 (three) times daily as needed for cough. 05/20/17 05/20/18  Enid Derry, PA-C  cephALEXin (KEFLEX) 500 MG capsule Take 1 capsule (500 mg total) by mouth 2 (two) times daily for 10 days. 02/09/18 02/19/18  Darci Current, MD  ibuprofen (ADVIL,MOTRIN) 600 MG tablet Take 1 tablet (600 mg total) by mouth every 6 (six) hours. 12/16/14   Nadara Mustard, MD  predniSONE (DELTASONE) 10 MG tablet Take 6 tablets on day 1, take 5 tablets on day 2, take 4 tablets on day 3, take 3 tablets on day 4, take 2 tablets on day 5, take 1 tablet on day 6 05/20/17   Enid Derry, PA-C  Prenatal Vit-Fe Fumarate-FA (PRENATAL VITAMIN PO) Take 1 tablet by mouth every morning.    [provider]    Allergies No  known drug allergies  No family history on file.  Social History Social History   Tobacco Use  . Smoking status: Former Smoker    Last attempt to quit: 09/04/2014    Years since quitting: 3.4  . Smokeless tobacco: Never Used  Substance Use Topics  . Alcohol use: No  . Drug use: No    Review of Systems Constitutional: No fever/chills Eyes: No visual changes. ENT: No sore throat. Cardiovascular: Denies chest pain. Respiratory: Denies shortness of breath. Gastrointestinal: No abdominal pain.  No nausea, no vomiting.  No diarrhea.  No constipation. Genitourinary: Negative for dysuria. Musculoskeletal: Negative for neck pain.  Negative for back pain. Integumentary: Negative for rash. Neurological: Negative for headaches, focal weakness or numbness.  ____________________________________________   PHYSICAL EXAM:  VITAL SIGNS: ED Triage Vitals  Enc Vitals Group     BP 02/09/18 0012 137/85     Pulse Rate 02/09/18 0012 83     Resp 02/09/18 0012 20     Temp 02/09/18 0012 98.3 F (36.8 C)     Temp Source 02/09/18 0012 Oral     SpO2 02/09/18 0012 97 %     Weight 02/09/18 0006 68 kg (150 lb)     Height 02/09/18 0006 1.575 m (5\' 2" )     Head Circumference --      Peak Flow --      Pain Score 02/09/18 0006 8  Pain Loc --      Pain Edu? --      Excl. in GC? --     Constitutional: Alert and oriented. Well appearing and in no acute distress. Eyes: Conjunctivae are normal. Head: Atraumatic. Mouth/Throat: Mucous membranes are moist.  Oropharynx non-erythematous. Neck: No stridor.  Cardiovascular: Normal rate, regular rhythm. Good peripheral circulation. Grossly normal heart sounds. Respiratory: Normal respiratory effort.  No retractions. Lungs CTAB. Gastrointestinal: Soft and nontender. No distention.  Musculoskeletal: No lower extremity tenderness nor edema. No gross deformities of extremities. Neurologic:  Normal speech and language. No gross focal neurologic deficits  are appreciated.  Skin:  Skin is warm, dry and intact. No rash noted. Psychiatric: Mood and affect are normal. Speech and behavior are normal.  ____________________________________________   LABS (all labs ordered are listed, but only abnormal results are displayed)  Labs Reviewed  CBC WITH DIFFERENTIAL/PLATELET - Abnormal; Notable for the following components:      Result Value   WBC 14.5 (*)    Neutro Abs 10.6 (*)    Monocytes Absolute 1.0 (*)    All other components within normal limits  COMPREHENSIVE METABOLIC PANEL - Abnormal; Notable for the following components:   Potassium 3.3 (*)    All other components within normal limits  URINALYSIS, COMPLETE (UACMP) WITH MICROSCOPIC - Abnormal; Notable for the following components:   Color, Urine YELLOW (*)    APPearance CLOUDY (*)    Specific Gravity, Urine 1.003 (*)    Hgb urine dipstick MODERATE (*)    Protein, ur 100 (*)    Leukocytes, UA LARGE (*)    WBC, UA >50 (*)    Bacteria, UA RARE (*)    All other components within normal limits  POCT PREGNANCY, URINE     RADIOLOGY I, [place] N BROWN, personally viewed and evaluated these images (plain radiographs) as part of my medical decision making, as well as reviewing the written report by the radiologist.  ED MD interpretation: No acute intra-abdominal findings on CT.  Official radiology report(s): Ct Renal Stone Study  Result Date: 02/09/2018 CLINICAL DATA:  Right flank pain since 20/2 100 hours yesterday. Urinary discomfort. EXAM: CT ABDOMEN AND PELVIS WITHOUT CONTRAST TECHNIQUE: Multidetector CT imaging of the abdomen and pelvis was performed following the standard protocol without IV contrast. COMPARISON:  None. FINDINGS: Lower chest: Lung bases are clear. Hepatobiliary: No focal liver abnormality is seen. No gallstones, gallbladder wall thickening, or biliary dilatation. Pancreas: Unremarkable. No pancreatic ductal dilatation or surrounding inflammatory changes. Spleen:  Normal in size without focal abnormality. Adrenals/Urinary Tract: Adrenal glands are unremarkable. Kidneys are normal, without renal calculi, focal lesion, or hydronephrosis. Bladder is unremarkable. Stomach/Bowel: Stomach, small bowel, and colon are mostly decompressed with scattered stool in the colon. No inflammatory infiltration is suggested. Appendix is normal. Vascular/Lymphatic: No significant vascular findings are present. No enlarged abdominal or pelvic lymph nodes. Reproductive: Uterus and ovaries are not enlarged. Uterus is retroverted. Intrauterine device appears to be positioned appropriately. Other: No abdominal wall hernia or abnormality. No abdominopelvic ascites. Musculoskeletal: No acute or significant osseous findings. IMPRESSION: No acute process demonstrated in the abdomen or pelvis. No renal or ureteral stone or obstruction. Electronically Signed   By: Burman NievesWilliam  Stevens M.D.   On: 02/09/2018 04:09     Procedures   ____________________________________________   INITIAL IMPRESSION / ASSESSMENT AND PLAN / ED COURSE  As part of my medical decision making, I reviewed the following data within the electronic MEDICAL RECORD NUMBER  27 year old female presented with above-stated history and physical exam concerning for possible kidney stone versus appendicitis versus urinary tract action.  Urinalysis consistent with UTI however hemoglobin noted in the urine raising possibility of kidney stone as such CT was performed which revealed no evidence of a stone.  Patient given Keflex in the emergency department and will be prescribed same for home ____________________________________________  FINAL CLINICAL IMPRESSION(S) / ED DIAGNOSES  Final diagnoses:  Acute cystitis without hematuria     MEDICATIONS GIVEN DURING THIS VISIT:  Medications  cephALEXin (KEFLEX) capsule 500 mg (has no administration in time range)  ketorolac (TORADOL) 30 MG/ML injection 30 mg (30 mg Intravenous Given  02/09/18 0342)     ED Discharge Orders        Ordered    cephALEXin (KEFLEX) 500 MG capsule  2 times daily     02/09/18 0436       Note:  This document was prepared using Dragon voice recognition software and may include unintentional dictation errors.    Darci Current, MD 02/09/18 [phone]

## 2018-02-09 NOTE — ED Notes (Signed)
PT to CT.

## 2019-07-04 ENCOUNTER — Encounter: Payer: Self-pay | Admitting: Obstetrics and Gynecology

## 2019-07-24 ENCOUNTER — Encounter: Payer: Self-pay | Admitting: Obstetrics and Gynecology

## 2019-07-24 ENCOUNTER — Ambulatory Visit: Payer: Self-pay | Admitting: Obstetrics and Gynecology

## 2020-04-14 ENCOUNTER — Encounter: Payer: Self-pay | Admitting: Emergency Medicine

## 2020-04-14 DIAGNOSIS — O021 Missed abortion: Secondary | ICD-10-CM | POA: Diagnosis not present

## 2020-04-14 DIAGNOSIS — D500 Iron deficiency anemia secondary to blood loss (chronic): Secondary | ICD-10-CM | POA: Diagnosis not present

## 2020-04-14 DIAGNOSIS — O034 Incomplete spontaneous abortion without complication: Secondary | ICD-10-CM | POA: Diagnosis present

## 2020-04-14 DIAGNOSIS — Z20822 Contact with and (suspected) exposure to covid-19: Secondary | ICD-10-CM | POA: Insufficient documentation

## 2020-04-14 DIAGNOSIS — Z87891 Personal history of nicotine dependence: Secondary | ICD-10-CM | POA: Diagnosis not present

## 2020-04-14 LAB — URINALYSIS, COMPLETE (UACMP) WITH MICROSCOPIC
Bacteria, UA: NONE SEEN
Bilirubin Urine: NEGATIVE
Glucose, UA: NEGATIVE mg/dL
Ketones, ur: NEGATIVE mg/dL
Nitrite: NEGATIVE
Protein, ur: NEGATIVE mg/dL
RBC / HPF: >50 RBC/hpf — ABNORMAL HIGH (ref 0–5)
Specific Gravity, Urine: 1.02 (ref 1.005–1.030)
pH: 7 (ref 5.0–8.0)

## 2020-04-14 LAB — COMPREHENSIVE METABOLIC PANEL
ALT: 15 U/L (ref 0–44)
AST: 20 U/L (ref 15–41)
Albumin: 3.8 g/dL (ref 3.5–5.0)
Alkaline Phosphatase: 79 U/L (ref 38–126)
Anion gap: 11 (ref 5–15)
BUN: 15 mg/dL (ref 6–20)
CO2: 24 mmol/L (ref 22–32)
Calcium: 8.8 mg/dL — ABNORMAL LOW (ref 8.9–10.3)
Chloride: 102 mmol/L (ref 98–111)
Creatinine, Ser: 0.72 mg/dL (ref 0.44–1.00)
GFR calc Af Amer: >60 mL/min (ref >60)
GFR calc non Af Amer: >60 mL/min (ref >60)
Glucose, Bld: 115 mg/dL — ABNORMAL HIGH (ref 70–99)
Potassium: 3.6 mmol/L (ref 3.5–5.1)
Sodium: 137 mmol/L (ref 135–145)
Total Bilirubin: 0.5 mg/dL (ref 0.3–1.2)
Total Protein: 7.7 g/dL (ref 6.5–8.1)

## 2020-04-14 LAB — CBC
HCT: 28.6 % — ABNORMAL LOW (ref 36.0–46.0)
Hemoglobin: 8.9 g/dL — ABNORMAL LOW (ref 12.0–15.0)
MCH: 27 pg (ref 26.0–34.0)
MCHC: 31.1 g/dL (ref 30.0–36.0)
MCV: 86.7 fL (ref 80.0–100.0)
Platelets: 302 10*3/uL (ref 150–400)
RBC: 3.3 MIL/uL — ABNORMAL LOW (ref 3.87–5.11)
RDW: 14.2 % (ref 11.5–15.5)
WBC: 10.5 10*3/uL (ref 4.0–10.5)
nRBC: 0 % (ref 0.0–0.2)

## 2020-04-14 LAB — POCT PREGNANCY, URINE: Preg Test, Ur: NEGATIVE

## 2020-04-14 LAB — LIPASE, BLOOD: Lipase: 35 U/L (ref 11–51)

## 2020-04-14 NOTE — ED Triage Notes (Signed)
Pt c/o right sided lower abdominal pain x2 days with no urinary symptoms, nor N/V. Pt reports she had a miscarriage in July and since has had vaginal bleeding.

## 2020-04-15 ENCOUNTER — Emergency Department: Payer: Medicaid Other

## 2020-04-15 ENCOUNTER — Emergency Department: Payer: Medicaid Other | Admitting: Certified Registered Nurse Anesthetist

## 2020-04-15 ENCOUNTER — Other Ambulatory Visit: Payer: Self-pay

## 2020-04-15 ENCOUNTER — Encounter: Payer: Self-pay | Admitting: Radiology

## 2020-04-15 ENCOUNTER — Ambulatory Visit
Admission: EM | Admit: 2020-04-15 | Discharge: 2020-04-15 | Disposition: A | Discharge status: Home / Self Care | Payer: Medicaid Other | Attending: Emergency Medicine | Admitting: Emergency Medicine

## 2020-04-15 ENCOUNTER — Encounter
Admission: EM | Disposition: A | Discharge status: Home / Self Care | Payer: Self-pay | Source: Home / Self Care | Attending: Emergency Medicine

## 2020-04-15 DIAGNOSIS — D500 Iron deficiency anemia secondary to blood loss (chronic): Secondary | ICD-10-CM

## 2020-04-15 DIAGNOSIS — O034 Incomplete spontaneous abortion without complication: Secondary | ICD-10-CM

## 2020-04-15 DIAGNOSIS — O021 Missed abortion: Secondary | ICD-10-CM

## 2020-04-15 DIAGNOSIS — N938 Other specified abnormal uterine and vaginal bleeding: Secondary | ICD-10-CM

## 2020-04-15 DIAGNOSIS — R109 Unspecified abdominal pain: Secondary | ICD-10-CM

## 2020-04-15 HISTORY — PX: DILATION AND EVACUATION: SHX1459

## 2020-04-15 LAB — CBC WITH DIFFERENTIAL/PLATELET
Abs Immature Granulocytes: 0.03 10*3/uL (ref 0.00–0.07)
Basophils Absolute: 0.1 10*3/uL (ref 0.0–0.1)
Basophils Relative: 1 %
Eosinophils Absolute: 0.2 10*3/uL (ref 0.0–0.5)
Eosinophils Relative: 4 %
HCT: 23.8 % — ABNORMAL LOW (ref 36.0–46.0)
Hemoglobin: 7.8 g/dL — ABNORMAL LOW (ref 12.0–15.0)
Immature Granulocytes: 1 %
Lymphocytes Relative: 31 %
Lymphs Abs: 1.5 10*3/uL (ref 0.7–4.0)
MCH: 27.3 pg (ref 26.0–34.0)
MCHC: 32.8 g/dL (ref 30.0–36.0)
MCV: 83.2 fL (ref 80.0–100.0)
Monocytes Absolute: 0.5 10*3/uL (ref 0.1–1.0)
Monocytes Relative: 9 %
Neutro Abs: 2.7 10*3/uL (ref 1.7–7.7)
Neutrophils Relative %: 54 %
Platelets: 216 10*3/uL (ref 150–400)
RBC: 2.86 MIL/uL — ABNORMAL LOW (ref 3.87–5.11)
RDW: 14.2 % (ref 11.5–15.5)
WBC: 4.9 10*3/uL (ref 4.0–10.5)
nRBC: 0 % (ref 0.0–0.2)

## 2020-04-15 LAB — URINE DRUG SCREEN, QUALITATIVE (ARMC ONLY)
Amphetamines, Ur Screen: NOT DETECTED
Barbiturates, Ur Screen: NOT DETECTED
Benzodiazepine, Ur Scrn: NOT DETECTED
Cannabinoid 50 Ng, Ur ~~LOC~~: POSITIVE — AB
Cocaine Metabolite,Ur ~~LOC~~: POSITIVE — AB
MDMA (Ecstasy)Ur Screen: NOT DETECTED
Methadone Scn, Ur: NOT DETECTED
Opiate, Ur Screen: NOT DETECTED
Phencyclidine (PCP) Ur S: NOT DETECTED
Tricyclic, Ur Screen: NOT DETECTED

## 2020-04-15 LAB — TYPE AND SCREEN
ABO/RH(D): O POS
Antibody Screen: NEGATIVE

## 2020-04-15 LAB — RESPIRATORY PANEL BY RT PCR (FLU A&B, COVID)
Influenza A by PCR: NEGATIVE
Influenza B by PCR: NEGATIVE
SARS Coronavirus 2 by RT PCR: NEGATIVE

## 2020-04-15 LAB — HCG, QUANTITATIVE, PREGNANCY: hCG, Beta Chain, Quant, S: 21 m[IU]/mL — ABNORMAL HIGH (ref <5)

## 2020-04-15 SURGERY — DILATION AND EVACUATION, UTERUS
Anesthesia: General

## 2020-04-15 MED ORDER — FENTANYL CITRATE (PF) 100 MCG/2ML IJ SOLN
INTRAMUSCULAR | Status: AC
  Administered 2020-04-15: 25 ug via INTRAVENOUS
  Filled 2020-04-15: qty 2

## 2020-04-15 MED ORDER — MIDAZOLAM HCL 2 MG/2ML IJ SOLN
INTRAMUSCULAR | Status: AC
  Filled 2020-04-15: qty 2

## 2020-04-15 MED ORDER — IOHEXOL 300 MG/ML  SOLN
100.0000 mL | Freq: Once | INTRAMUSCULAR | Status: AC | PRN
  Administered 2020-04-15: 100 mL via INTRAVENOUS

## 2020-04-15 MED ORDER — PROPOFOL 10 MG/ML IV BOLUS
INTRAVENOUS | Status: AC
  Filled 2020-04-15: qty 20

## 2020-04-15 MED ORDER — KETOROLAC TROMETHAMINE 30 MG/ML IJ SOLN
INTRAMUSCULAR | Status: AC
  Administered 2020-04-15: 30 mg via INTRAVENOUS
  Filled 2020-04-15: qty 1

## 2020-04-15 MED ORDER — OXYTOCIN 10 UNIT/ML IJ SOLN
INTRAMUSCULAR | Status: AC
  Filled 2020-04-15: qty 2

## 2020-04-15 MED ORDER — PROPOFOL 10 MG/ML IV BOLUS
INTRAVENOUS | Status: DC | PRN
  Administered 2020-04-15: 100 mg via INTRAVENOUS

## 2020-04-15 MED ORDER — ONDANSETRON HCL 4 MG/2ML IJ SOLN
INTRAMUSCULAR | Status: DC | PRN
  Administered 2020-04-15: 4 mg via INTRAVENOUS

## 2020-04-15 MED ORDER — LIDOCAINE HCL (PF) 2 % IJ SOLN
INTRAMUSCULAR | Status: AC
  Filled 2020-04-15: qty 5

## 2020-04-15 MED ORDER — MIDAZOLAM HCL 2 MG/2ML IJ SOLN
INTRAMUSCULAR | Status: DC | PRN
  Administered 2020-04-15: 1 mg via INTRAVENOUS

## 2020-04-15 MED ORDER — ACETAMINOPHEN 10 MG/ML IV SOLN
INTRAVENOUS | Status: DC | PRN
  Administered 2020-04-15: 1000 mg via INTRAVENOUS

## 2020-04-15 MED ORDER — ONDANSETRON HCL 4 MG/2ML IJ SOLN
4.0000 mg | Freq: Once | INTRAMUSCULAR | Status: AC
  Administered 2020-04-15: 4 mg via INTRAVENOUS
  Filled 2020-04-15: qty 2

## 2020-04-15 MED ORDER — OXYCODONE-ACETAMINOPHEN 5-325 MG PO TABS: 1.0000 | ORAL_TABLET | ORAL | Status: DC | PRN

## 2020-04-15 MED ORDER — FENTANYL CITRATE (PF) 100 MCG/2ML IJ SOLN
INTRAMUSCULAR | Status: DC | PRN
Start: 2020-04-15 — End: 2020-04-15
  Administered 2020-04-15: 50 ug via INTRAVENOUS

## 2020-04-15 MED ORDER — ONDANSETRON 4 MG PO TBDP: 4.0000 mg | ORAL_TABLET | Freq: Four times a day (QID) | ORAL | Status: DC | PRN

## 2020-04-15 MED ORDER — SODIUM CHLORIDE 0.9 % IV BOLUS
1000.0000 mL | Freq: Once | INTRAVENOUS | Status: AC
  Administered 2020-04-15: 1000 mL via INTRAVENOUS

## 2020-04-15 MED ORDER — KETOROLAC TROMETHAMINE 30 MG/ML IJ SOLN
15.0000 mg | Freq: Once | INTRAMUSCULAR | Status: AC
  Administered 2020-04-15: 15 mg via INTRAVENOUS
  Filled 2020-04-15: qty 1

## 2020-04-15 MED ORDER — ONDANSETRON HCL 4 MG/2ML IJ SOLN: 4.0000 mg | Freq: Four times a day (QID) | INTRAMUSCULAR | Status: DC | PRN

## 2020-04-15 MED ORDER — FENTANYL CITRATE (PF) 100 MCG/2ML IJ SOLN
25.0000 ug | INTRAMUSCULAR | Status: DC | PRN
  Administered 2020-04-15 (×3): 25 ug via INTRAVENOUS

## 2020-04-15 MED ORDER — HYDROCODONE-ACETAMINOPHEN 5-325 MG PO TABS: 1.0000 | ORAL_TABLET | Freq: Four times a day (QID) | ORAL | 0 refills | Status: DC | PRN

## 2020-04-15 MED ORDER — LACTATED RINGERS IV SOLN: INTRAVENOUS | Status: DC

## 2020-04-15 MED ORDER — FENTANYL CITRATE (PF) 100 MCG/2ML IJ SOLN
INTRAMUSCULAR | Status: AC
  Filled 2020-04-15: qty 2

## 2020-04-15 MED ORDER — PHENYLEPHRINE HCL (PRESSORS) 10 MG/ML IV SOLN
INTRAVENOUS | Status: DC | PRN
  Administered 2020-04-15: 200 ug via INTRAVENOUS

## 2020-04-15 MED ORDER — ACETAMINOPHEN 10 MG/ML IV SOLN
INTRAVENOUS | Status: AC
  Filled 2020-04-15: qty 100

## 2020-04-15 MED ORDER — ONDANSETRON HCL 4 MG/2ML IJ SOLN
INTRAMUSCULAR | Status: AC
  Filled 2020-04-15: qty 2

## 2020-04-15 MED ORDER — LIDOCAINE HCL (CARDIAC) PF 100 MG/5ML IV SOSY
PREFILLED_SYRINGE | INTRAVENOUS | Status: DC | PRN
  Administered 2020-04-15: 80 mg via INTRAVENOUS

## 2020-04-15 MED ORDER — ONDANSETRON HCL 4 MG/2ML IJ SOLN: 4.0000 mg | Freq: Once | INTRAMUSCULAR | Status: DC | PRN

## 2020-04-15 MED ORDER — DEXAMETHASONE SODIUM PHOSPHATE 10 MG/ML IJ SOLN
INTRAMUSCULAR | Status: AC
  Filled 2020-04-15: qty 1

## 2020-04-15 MED ORDER — OXYTOCIN 10 UNIT/ML IJ SOLN
INTRAMUSCULAR | Status: DC | PRN
  Administered 2020-04-15: 40 [IU]

## 2020-04-15 MED ORDER — DEXAMETHASONE SODIUM PHOSPHATE 10 MG/ML IJ SOLN
INTRAMUSCULAR | Status: DC | PRN
  Administered 2020-04-15: 8 mg via INTRAVENOUS

## 2020-04-15 MED ORDER — KETOROLAC TROMETHAMINE 30 MG/ML IJ SOLN: 30.0000 mg | Freq: Once | INTRAMUSCULAR | Status: AC

## 2020-04-15 MED ORDER — DEXTROSE IN LACTATED RINGERS 5 % IV SOLN: INTRAVENOUS | Status: DC

## 2020-04-15 SURGICAL SUPPLY — 26 items
ADAPTER VACURETTE TBG SET 14 (CANNULA) IMPLANT
CATH ROBINSON RED A/P 16FR (CATHETERS) ×2 IMPLANT
COVER WAND RF STERILE (DRAPES) IMPLANT
CUP MEDICINE 2OZ PLAST GRAD ST (MISCELLANEOUS) ×2 IMPLANT
DRSG TELFA 3X8 NADH (GAUZE/BANDAGES/DRESSINGS) ×2 IMPLANT
FILTER UTR ASPR SPEC (MISCELLANEOUS) ×1 IMPLANT
FLTR UTR ASPR SPEC (MISCELLANEOUS) ×2
GAUZE 4X4 16PLY RFD (DISPOSABLE) ×2 IMPLANT
GLOVE BIOGEL PI ORTHO PRO 7.5 (GLOVE) ×1
GLOVE PI ORTHO PRO STRL 7.5 (GLOVE) ×1 IMPLANT
GOWN STRL REUS W/ TWL LRG LVL3 (GOWN DISPOSABLE) ×2 IMPLANT
GOWN STRL REUS W/TWL LRG LVL3 (GOWN DISPOSABLE) ×4
KIT BERKELEY 1ST TRIMESTER 3/8 (MISCELLANEOUS) ×2 IMPLANT
KIT TURNOVER KIT A (KITS) ×2 IMPLANT
NEEDLE HYPO 25X1 1.5 SAFETY (NEEDLE) ×2 IMPLANT
PACK DNC HYST (MISCELLANEOUS) ×2 IMPLANT
PAD OB MATERNITY 4.3X12.25 (PERSONAL CARE ITEMS) ×2 IMPLANT
PAD PREP 24X41 OB/GYN DISP (PERSONAL CARE ITEMS) ×2 IMPLANT
SET BERKELEY SUCTION TUBING (SUCTIONS) ×2 IMPLANT
SOL PREP PVP 2OZ (MISCELLANEOUS)
SOLUTION PREP PVP 2OZ (MISCELLANEOUS) IMPLANT
VACURETTE 10 RIGID CVD (CANNULA) ×2 IMPLANT
VACURETTE 12 RIGID CVD (CANNULA) IMPLANT
VACURETTE 7MM F TIP (CANNULA)
VACURETTE 7MM F TIP STRL (CANNULA) IMPLANT
VACURETTE 8 RIGID CVD (CANNULA) IMPLANT

## 2020-04-15 NOTE — ED Notes (Addendum)
Spoke with SD regarding pt's plan. Pt should be going to OR for noon procedure. Report given

## 2020-04-15 NOTE — Anesthesia Preprocedure Evaluation (Signed)
Anesthesia Evaluation  Patient identified by MRN, date of birth, ID band Patient awake    Reviewed: Allergy & Precautions, H&P , NPO status , Patient's Chart, lab work & pertinent test results, reviewed documented beta blocker date and time   Airway Mallampati: II  TM Distance: >3 FB Neck ROM: full    Dental  (+) Teeth Intact   Pulmonary neg pulmonary ROS, former smoker,    Pulmonary exam normal        Cardiovascular Exercise Tolerance: Good negative cardio ROS Normal cardiovascular exam Rate:Normal     Neuro/Psych  Headaches, negative psych ROS   GI/Hepatic negative GI ROS, Neg liver ROS,   Endo/Other  negative endocrine ROS  Renal/GU negative Renal ROS  negative genitourinary   Musculoskeletal   Abdominal   Peds  Hematology negative hematology ROS (+)   Anesthesia Other Findings   Reproductive/Obstetrics negative OB ROS                             Anesthesia Physical Anesthesia Plan  ASA: II  Anesthesia Plan: General LMA   Post-op Pain Management:    Induction:   PONV Risk Score and Plan: 4 or greater  Airway Management Planned:   Additional Equipment:   Intra-op Plan:   Post-operative Plan:   Informed Consent: I have reviewed the patients History and Physical, chart, labs and discussed the procedure including the risks, benefits and alternatives for the proposed anesthesia with the patient or authorized representative who has indicated his/her understanding and acceptance.       Plan Discussed with: CRNA  Anesthesia Plan Comments:         Anesthesia Quick Evaluation

## 2020-04-15 NOTE — Op Note (Signed)
    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 decidua  SPECIMEN:  ID Type Source Tests Collected by Time Destination  1 : UTERINE CONTENTS Tissue PATH POC SURGICAL PATHOLOGY Linzie Collin, MD 04/15/2020 1116     COMPLICATIONS: None  DRAINS: Foley to gravity  DISPOSITION: Stable to recovery room  DESCRIPTION OF PROCEDURE:      The patient was prepped and draped in the dorsal lithotomy position and placed under general anesthesia. Her cervix was grasped with a Jacob's tenaculum. Respecting the position and curvature of her cervix, it was dilated to accommodate a number 10 suction curette. The suction curette was placed within the endometrial cavity and a pressure greater than 65 mmHg was allowed to build. A systematic curettage was performed in all quadrants until no additional tissue was noted. A systematic curettage was performed and was productive of tissue which appeared to be decidua. The uterus became firm and globular. Pitocin was run in the IV. The tenaculum was removed from the cervix and hemostasis was noted. The weighted speculum was removed and the patient went to recovery room in stable condition.  No follow-up provider specified.  Elonda Husky, M.D. 04/15/2020 11:26 AM

## 2020-04-15 NOTE — ED Notes (Signed)
Pt back from US at this time.

## 2020-04-15 NOTE — ED Notes (Signed)
Pt denies any bleeding at this time

## 2020-04-15 NOTE — ED Provider Notes (Signed)
[facility] Emergency Department Provider Note  ____________________________________________  Time seen: Approximately 2:13 AM  I have reviewed the triage vital signs and the nursing notes.   HISTORY  Chief Complaint Abdominal Pain   HPI Katherine Fitzgerald is a 29 y.o. female presents for evaluation of abdominal pain.  Patient reports 2 days of constant sharp right lower quadrant abdominal pain associated with one episode of nonbloody nonbilious emesis.  The pain is worse with intercourse.  No fever chills, no dysuria or hematuria, no diarrhea or constipation.  Patient also reports that she had a missed abortion 2 months ago for which she was given Cytotec.  Since then she has been bleeding every day.  Some days she is just spotting and others she is passing large clots.  She denies any dizziness, shortness of breath or chest pain.  No history of bleeding disorders.  She is not on birth control.   Past Medical History:  Diagnosis Date  . Migraine   . Spontaneous abortion 12/2013    Patient Active Problem List   Diagnosis Date Noted  . Active labor at term 12/14/2014  . Labor and delivery, indication for care 12/13/2014  . Pregnancy 12/03/2014  . Indication for care in labor and delivery, antepartum 12/03/2014    History reviewed. No pertinent surgical history.  Prior to Admission medications   Medication Sig Start Date End Date Taking? Authorizing Provider  azithromycin (ZITHROMAX Z-PAK) 250 MG tablet Take 2 tablets (500 mg) on  Day 1,  followed by 1 tablet (250 mg) once daily on Days 2 through 5. 05/20/17   Enid Derry, PA-C  ibuprofen (ADVIL,MOTRIN) 600 MG tablet Take 1 tablet (600 mg total) by mouth every 6 (six) hours. 12/16/14   Nadara Mustard, MD  predniSONE (DELTASONE) 10 MG tablet Take 6 tablets on day 1, take 5 tablets on day 2, take 4 tablets on day 3, take 3 tablets on day 4, take 2 tablets on day 5, take 1 tablet on day 6 05/20/17   Enid Derry, PA-C  Prenatal Vit-Fe Fumarate-FA (PRENATAL VITAMIN PO) Take 1 tablet by mouth every morning.    [provider]    Allergies Patient has no known allergies.  History reviewed. No pertinent family history.  Social History Social History   Tobacco Use  . Smoking status: Former Smoker    Quit date: 09/04/2014    Years since quitting: 5.6  . Smokeless tobacco: Never Used  Substance Use Topics  . Alcohol use: No  . Drug use: No    Review of Systems  Constitutional: Negative for fever. Eyes: Negative for visual changes. ENT: Negative for sore throat. Neck: No neck pain  Cardiovascular: Negative for chest pain. Respiratory: Negative for shortness of breath. Gastrointestinal: + RLQ abdominal pain, nausea, and vomiting. No diarrhea. Genitourinary: Negative for dysuria. + vaginal bleeding Musculoskeletal: Negative for back pain. Skin: Negative for rash. Neurological: Negative for headaches, weakness or numbness. Psych: No SI or HI  ____________________________________________   PHYSICAL EXAM:  VITAL SIGNS: ED Triage Vitals [04/14/20 1945]  Enc Vitals Group     BP 128/79     Pulse Rate 89     Resp 17     Temp 98.8 F (37.1 C)     Temp Source Oral     SpO2 100 %     Weight      Height      Head Circumference      Peak Flow  Pain Score      Pain Loc      Pain Edu?      Excl. in GC?     Constitutional: Alert and oriented. Well appearing and in no apparent distress. HEENT:      Head: Normocephalic and atraumatic.         Eyes: Conjunctivae are normal. Sclera is non-icteric.       Mouth/Throat: Mucous membranes are moist.       Neck: Supple with no signs of meningismus. Cardiovascular: Regular rate and rhythm. No murmurs, gallops, or rubs. Respiratory: Normal respiratory effort. Lungs are clear to auscultation bilaterally. Gastrointestinal: Soft, tender to palpation the right lower quadrant, positive Rovsing sign, and non distended with  positive bowel sounds. No rebound or guarding. Genitourinary: No CVA tenderness. Musculoskeletal: Nontender with normal range of motion in all extremities. No edema, cyanosis, or erythema of extremities. Neurologic: Normal speech and language. Face is symmetric. Moving all extremities. No gross focal neurologic deficits are appreciated. Skin: Skin is warm, dry and intact. No rash noted. Psychiatric: Mood and affect are normal. Speech and behavior are normal.  ____________________________________________   LABS (all labs ordered are listed, but only abnormal results are displayed)  Labs Reviewed  COMPREHENSIVE METABOLIC PANEL - Abnormal; Notable for the following components:      Result Value   Glucose, Bld 115 (*)    Calcium 8.8 (*)    All other components within normal limits  CBC - Abnormal; Notable for the following components:   RBC 3.30 (*)    Hemoglobin 8.9 (*)    HCT 28.6 (*)    All other components within normal limits  URINALYSIS, COMPLETE (UACMP) WITH MICROSCOPIC - Abnormal; Notable for the following components:   Color, Urine YELLOW (*)    APPearance HAZY (*)    Hgb urine dipstick LARGE (*)    Leukocytes,Ua TRACE (*)    RBC / HPF >50 (*)    All other components within normal limits  HCG, QUANTITATIVE, PREGNANCY - Abnormal; Notable for the following components:   hCG, Beta Chain, Quant, S 21 (*)    All other components within normal limits  RESPIRATORY PANEL BY RT PCR (FLU A&B, COVID)  LIPASE, BLOOD  POC URINE PREG, ED  POCT PREGNANCY, URINE  TYPE AND SCREEN   ____________________________________________  EKG  none  ____________________________________________  RADIOLOGY  I have personally reviewed the images performed during this visit and I agree with the Radiologist's read.   Interpretation by Radiologist:  CT ABDOMEN PELVIS W CONTRAST  Result Date: 04/15/2020 CLINICAL DATA:  Right lower abdominal pain, miscarriage in July with persistent vaginal  bleeding EXAM: CT ABDOMEN AND PELVIS WITH CONTRAST TECHNIQUE: Multidetector CT imaging of the abdomen and pelvis was performed using the standard protocol following bolus administration of intravenous contrast. CONTRAST:  OMNIPAQUE IOHEXOL 300 MG/ML  SOLN COMPARISON:  CT renal colic 02/09/2018 FINDINGS: Lower chest: Lung bases are clear. Normal heart size. No pericardial effusion. Hepatobiliary: No worrisome focal liver lesions. Smooth liver surface contour. Normal hepatic attenuation. Normal gallbladder and biliary tree. Pancreas: Unremarkable. No pancreatic ductal dilatation or surrounding inflammatory changes. Spleen: Normal in size. No concerning splenic lesions. Adrenals/Urinary Tract: Normal adrenal glands. Kidneys are normally located with symmetric enhancement. No suspicious renal lesion, urolithiasis or hydronephrosis. Urinary bladder is largely decompressed at the time of exam and therefore poorly evaluated by CT imaging. No gross bladder abnormality. Stomach/Bowel: Distal esophagus, stomach and duodenal sweep are unremarkable. No small bowel wall thickening  or dilatation. No evidence of obstruction. A normal appendix is visualized. No colonic dilatation or wall thickening. Vascular/Lymphatic: There is marked prominence the parametrial vessels as well as some serpiginous hyperattenuation within the endometrial canal further detailed in the reproductive section below. No other major vascular abnormality is seen. No suspicious or enlarged lymph nodes in the included lymphatic chains. Reproductive: Uterus is retroflexed. There is marked prominence of the parametrial venous plexus and venous channels within the myometrium itself. There is a heterogeneously thickened endometrium with some hyperenhancing material within the endometrial canal itself. No concerning adnexal lesions with a normal appearance of the bilateral ovaries. Other: No abdominopelvic free fluid or free gas. No bowel containing hernias.  Musculoskeletal: No acute osseous abnormality or suspicious osseous lesion. Transitional lumbosacral anatomy with a partially sacralized L5 left transverse process and rudimentary disc L5-S1. Rudimentary ribs are also noted at the lowest thoracic level. IMPRESSION: 1. Retroverted uterus with marked prominence of the parametrial venous plexus and venous channels within the myometrium itself. There is a heterogeneously thickened endometrium with some hyperenhancing material within the endometrial canal itself. Given patient's history of missed abortion and Cytotec administration this appearance may raise concern for retained products of conception or potential development of an acquired uterine arteriovenous malformation. Trophoblastic disease is significantly less favored but could be better excluded with beta HCG measurement. Pelvic congestion could explain the parametrial vascularity but not the endometrial findings. Recommend pelvic ultrasound with transabdominal and transvaginal scanning and Doppler assessment as well as gynecologic consultation for further management. These results were called by telephone at the time of interpretation on 04/15/2020 at 3:04 am to provider [facility] , who verbally acknowledged these results. Electronically Signed   By: Kreg Shropshire M.D.   On: 04/15/2020 03:04   US PELVIC COMPLETE W TRANSVAGINAL AND TORSION R/O  Result Date: 04/15/2020 CLINICAL DATA:  Bleeding, missed abortion, received Cytotec, abnormal CT EXAM: TRANSABDOMINAL AND TRANSVAGINAL ULTRASOUND OF PELVIS DOPPLER ULTRASOUND OF OVARIES TECHNIQUE: Both transabdominal and transvaginal ultrasound examinations of the pelvis were performed. Transabdominal technique was performed for global imaging of the pelvis including uterus, ovaries, adnexal regions, and pelvic cul-de-sac. It was necessary to proceed with endovaginal exam following the transabdominal exam to visualize the endometrium. Color and duplex Doppler  ultrasound was utilized to evaluate blood flow to the ovaries. COMPARISON:  CT 04/15/2020, obstetrical ultrasound 12/08/2013 FINDINGS: Uterus Measurements: 7.3 x 4.2 x 5.2 cm = volume: 82 mL. Retroflexed uterus. Prominent vascular channels are seen noted within the myometrium. No focal myometrial mass or discernible fibroids. Endometrium Thickness: Thickened to 24 mm. Heterogeneous mixed echogenicity material is seen within the endometrial canal with some demonstrable internal color Doppler flow. Right ovary Measurements: 4.3 x 1.8 x 1.9 cm = volume: 7.6 mL. Normal appearance. Normal follicles. No concerning adnexal lesion. Left ovary Measurements: 2.9 x 1.8 x 1.7 cm = volume: 4.5 mL. Normal appearance. Normal follicles. No concerning adnexal lesion. Pulsed Doppler evaluation of both ovaries demonstrates normal low-resistance arterial and venous waveforms. Other findings No abnormal free fluid. IMPRESSION: Heterogeneous echogenic material within the endometrial canal demonstrating color Doppler flow is most compatible with retained products of conception in the appropriate clinical context. The prominent vascularity demonstrated on CT and again seen with extensive vascular channels within the myometrium may be related to protracted bleeding with AVM less favored. Gynecologic consultation is warranted for further management. Electronically Signed   By: Kreg Shropshire M.D.   On: 04/15/2020 04:04      ____________________________________________  PROCEDURES  Procedure(s) performed:yes .1-3 Lead EKG Interpretation Performed by: Nita SickleVeronese, [place], MD Authorized by: Nita SickleVeronese, [place], MD     Interpretation: normal     ECG rate assessment: normal     Rhythm: sinus rhythm     Ectopy: none     Critical Care performed: yes  CRITICAL CARE Performed by: Nita Sicklearolina Maloree Fitzgerald  ?  Total critical care time: 35 min  Critical care time was exclusive of separately billable procedures and treating other  patients.  Critical care was necessary to treat or prevent imminent or life-threatening deterioration.  Critical care was time spent personally by me on the following activities: development of treatment plan with patient and/or surrogate as well as nursing, discussions with consultants, evaluation of patient's response to treatment, examination of patient, obtaining history from patient or surrogate, ordering and performing treatments and interventions, ordering and review of laboratory studies, ordering and review of radiographic studies, pulse oximetry and re-evaluation of patient's condition.  ____________________________________________   INITIAL IMPRESSION / ASSESSMENT AND PLAN / ED COURSE  29 y.o. female presents for evaluation of 2 days of sharp, constant RLQ abdominal pain associated with N/V. Also complaining of 2 months of daily vaginal bleeding since taking Cytotec for a missed abortion.  Patient is hemodynamically stable, abdomen is soft and nondistended with right lower quadrant tenderness and positive Rovsing sign.  Differential diagnoses including appendicitis versus retained POC versus ovarian cyst versus torsion versus PID versus pregnancy versus ectopic pregnancy versus gallbladder pathology.  Labs do show signs of acute blood loss anemia with hemoglobin that went down from 12 on July 2021 to 8.9 from AUB. No personal or family history of DVT or PE.  Will start patient on OCPs and iron supplementation.  At this time I do not feel that there is a need for blood transfusion since the bleeding has been ongoing for 2 months and patient is asymptomatic.  Her pregnancy test is negative.  We will get a CT of abdomen pelvis.  We will give her a dose of IV Toradol and Zofran for symptom relief.  Old medical records reviewed.   _________________________ 4:51 AM on 04/15/2020 -----------------------------------------  Work up consistent with retained products of conception.  Discussed  with Dr. Logan BoresEvans from OB/GYN who will take patient to the OR for a D&C.  Discussed this with patient who is in agreement.      _____________________________________________ Please note:  Patient was evaluated in Emergency Department today for the symptoms described in the history of present illness. Patient was evaluated in the context of the global COVID-19 pandemic, which necessitated consideration that the patient might be at risk for infection with the SARS-CoV-2 virus that causes COVID-19. Institutional protocols and algorithms that pertain to the evaluation of patients at risk for COVID-19 are in a state of rapid change based on information released by regulatory bodies including the CDC and federal and state organizations. These policies and algorithms were followed during the patient's care in the ED.  Some ED evaluations and interventions may be delayed as a result of limited staffing during the pandemic.   [place] Controlled Substance Database was reviewed by me. ____________________________________________   FINAL CLINICAL IMPRESSION(S) / ED DIAGNOSES   Final diagnoses:  Abdominal pain  Retained products of conception after miscarriage  Anemia, blood loss      NEW MEDICATIONS STARTED DURING THIS VISIT:  ED Discharge Orders    None       Note:  This document was prepared using Dragon voice  recognition software and may include unintentional dictation errors.    Don Perking, [place], MD 04/15/20 [phone]

## 2020-04-15 NOTE — H&P (Addendum)
PRE-OPERATIVE HISTORY AND PHYSICAL EXAM  PCP:  Pcp, No Subjective:   HPI:  Katherine Fitzgerald is a 29 y.o. [phone].  No LMP recorded. (Menstrual status: IUD).  She presented to the ED yesterday with c/o persistent vaginal bleeding and abdominal pain.  Patient reports 2 days of constant sharp right lower quadrant abdominal pain associated with one episode of emesis.   Patient also reports that she had a missed abortion 2 months ago for which she was given Cytotec.  Since then she has been bleeding every day.  Presented to OB FU and  "nothing was done."  Some days she is just spotting and others she is passing large clots.  Pt not using birth control at this time. No fever chills, no dysuria or hematuria, no diarrhea or constipation.   Review of Systems:   Constitutional: Denied constitutional symptoms, night sweats, recent illness, fatigue, fever, insomnia and weight loss.  Eyes: Denied eye symptoms, eye pain, photophobia, vision change and visual disturbance.  Ears/Nose/Throat/Neck: Denied ear, nose, throat or neck symptoms, hearing loss, nasal discharge, sinus congestion and sore throat.  Cardiovascular: Denied cardiovascular symptoms, arrhythmia, chest pain/pressure, edema, exercise intolerance, orthopnea and palpitations.  Respiratory: Denied pulmonary symptoms, asthma, pleuritic pain, productive sputum, cough, dyspnea and wheezing.  Gastrointestinal: Denied, gastro-esophageal reflux, melena, nausea and vomiting.  Genitourinary: See HPI for additional information.  Musculoskeletal: Denied musculoskeletal symptoms, stiffness, swelling, muscle weakness and myalgia.  Dermatologic: Denied dermatology symptoms, rash and scar.  Neurologic: Denied neurology symptoms, dizziness, headache, neck pain and syncope.  Psychiatric: Denied psychiatric symptoms, anxiety and depression.  Endocrine: Denied endocrine symptoms including hot flashes and night sweats.   OB History  Gravida Para Term  Preterm AB Living  4 3 3   1 3   SAB TAB Ectopic Multiple Live Births  1     0 3    # Outcome Date GA Lbr Len/2nd Weight Sex Delivery Anes PTL Lv  4 Term 12/14/14 [redacted]w[redacted]d / 00:18 3250 g M Vag-Spont EPI  LIV  3 SAB 01/02/14 [redacted]w[redacted]d     None  FD  2 Term 08/08/10 [redacted]w[redacted]d  3033 g F Vag-Spont None  LIV  1 Term 12/02/08 [redacted]w[redacted]d  3090 g F Vag-Spont EPI  LIV    Past Medical History:  Diagnosis Date  . Migraine   . Spontaneous abortion 12/2013    History reviewed. No pertinent surgical history.    SOCIAL HISTORY: Social History   Tobacco Use  Smoking Status Former Smoker  . Quit date: 09/04/2014  . Years since quitting: 5.6  Smokeless Tobacco Never Used   Social History   Substance and Sexual Activity  Alcohol Use No   Social History   Substance and Sexual Activity  Drug Use No    History reviewed. No pertinent family history.  ALLERGIES:  Patient has no known allergies.  MEDS:   No current facility-administered medications on file prior to encounter.   No current outpatient medications on file prior to encounter.    Meds ordered this encounter  Medications  . ketorolac (TORADOL) 30 MG/ML injection 15 mg  . ondansetron (ZOFRAN) injection 4 mg  . iohexol (OMNIPAQUE) 300 MG/ML solution 100 mL  . sodium chloride 0.9 % bolus 1,000 mL     Physical examination BP 115/79 (BP Location: Right Arm)   Pulse 64   Temp 98.8 F (37.1 C) (Oral)   Resp 16   SpO2 100%   General NAD, Conversant  HEENT Atraumatic; Op clear with mmm.  Normo-cephalic. Pupils reactive. Anicteric sclerae  Thyroid/Neck Smooth without nodularity or enlargement. Normal ROM.  Neck Supple.  Skin No rashes, lesions or ulceration. Normal palpated skin turgor. No nodularity.  Breasts: Deferred  Lungs: See ED exam  Heart: See ED exam  Abdomen: Soft.  Non-tender.  No masses.  No HSM. No hernia  Extremities: Moves all appropriately.  Normal ROM for age. No lymphadenopathy.  Neuro: Oriented to PPT.  Normal  mood. Normal affect.   Pelvic exam deferred to OR.   US PELVIC COMPLETE W TRANSVAGINAL AND TORSION R/O  Result Date: 04/15/2020 CLINICAL DATA:  Bleeding, missed abortion, received Cytotec, abnormal CT EXAM: TRANSABDOMINAL AND TRANSVAGINAL ULTRASOUND OF PELVIS DOPPLER ULTRASOUND OF OVARIES TECHNIQUE: Both transabdominal and transvaginal ultrasound examinations of the pelvis were performed. Transabdominal technique was performed for global imaging of the pelvis including uterus, ovaries, adnexal regions, and pelvic cul-de-sac. It was necessary to proceed with endovaginal exam following the transabdominal exam to visualize the endometrium. Color and duplex Doppler ultrasound was utilized to evaluate blood flow to the ovaries. COMPARISON:  CT 04/15/2020, obstetrical ultrasound 12/08/2013 FINDINGS: Uterus Measurements: 7.3 x 4.2 x 5.2 cm = volume: 82 mL. Retroflexed uterus. Prominent vascular channels are seen noted within the myometrium. No focal myometrial mass or discernible fibroids. Endometrium Thickness: Thickened to 24 mm. Heterogeneous mixed echogenicity material is seen within the endometrial canal with some demonstrable internal color Doppler flow. Right ovary Measurements: 4.3 x 1.8 x 1.9 cm = volume: 7.6 mL. Normal appearance. Normal follicles. No concerning adnexal lesion. Left ovary Measurements: 2.9 x 1.8 x 1.7 cm = volume: 4.5 mL. Normal appearance. Normal follicles. No concerning adnexal lesion. Pulsed Doppler evaluation of both ovaries demonstrates normal low-resistance arterial and venous waveforms. Other findings No abnormal free fluid. IMPRESSION: Heterogeneous echogenic material within the endometrial canal demonstrating color Doppler flow is most compatible with retained products of conception in the appropriate clinical context. The prominent vascularity demonstrated on CT and again seen with extensive vascular channels within the myometrium may be related to protracted bleeding with AVM  less favored. Gynecologic consultation is warranted for further management. Electronically Signed   By: Kreg Shropshire M.D.   On: 04/15/2020 04:04     Assessment:   [id number] Patient Active Problem List   Diagnosis Date Noted  . Active labor at term 12/14/2014  . Labor and delivery, indication for care 12/13/2014  . Pregnancy 12/03/2014  . Indication for care in labor and delivery, antepartum 12/03/2014    1. Retained products of conception after miscarriage   2. Abdominal pain   3. Anemia, blood loss      Plan:   Orders: Meds ordered this encounter  Medications  . ketorolac (TORADOL) 30 MG/ML injection 15 mg  . ondansetron (ZOFRAN) injection 4 mg  . iohexol (OMNIPAQUE) 300 MG/ML solution 100 mL  . sodium chloride 0.9 % bolus 1,000 mL     1.  D&E  Pre-op discussions regarding Risks and Benefits of her scheduled surgery.  D&E The procedure and the risks and benefits of dilation and curettage/evacuation have been explained to the patient.  The specific risks of bleeding, infection, anesthesia, uterine perforation, and damage to bowel or bladder have been specifically discussed. The increased risk associated with prolonged retained products including the risk of infection and uterine perforation have be explained to the patient.  I have answered all of her questions and I believe that she has an adequate and informed understanding of  this procedure.  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  With pt having an low Hbg and dropping further during her ED stay, will proceed with D&E as emergent.    Elonda Husky, M.D. 04/15/2020 7:12 AM

## 2020-04-15 NOTE — Transfer of Care (Signed)
Immediate Anesthesia Transfer of Care Note  Patient: Army Fossa  Procedure(s) Performed: DILATATION AND EVACUATION (N/A )  Patient Location: PACU  Anesthesia Type:General  Level of Consciousness: drowsy  Airway & Oxygen Therapy: Patient Spontanous Breathing and Patient connected to face mask oxygen  Post-op Assessment: Report given to RN and Post -op Vital signs reviewed and stable  Post vital signs: Reviewed and stable  Last Vitals:  Vitals Value Taken Time  BP 104/75 04/15/20 1143  Temp 36.4 C 04/15/20 1143  Pulse 66 04/15/20 1145  Resp 18 04/15/20 1145  SpO2 100 % 04/15/20 1145  Vitals shown include unvalidated device data.  Last Pain:  Vitals:   04/15/20 1015  TempSrc: Tympanic  PainSc: 7          Complications: No complications documented.

## 2020-04-15 NOTE — Anesthesia Procedure Notes (Signed)
Procedure Name: LMA Insertion Date/Time: 04/15/2020 11:00 AM Performed by: Ginger Carne, CRNA Pre-anesthesia Checklist: Patient identified, Emergency Drugs available, Suction available, Patient being monitored and Timeout performed Patient Re-evaluated:Patient Re-evaluated prior to induction Oxygen Delivery Method: Circle system utilized Preoxygenation: Pre-oxygenation with 100% oxygen Induction Type: IV induction Ventilation: Mask ventilation without difficulty LMA: LMA inserted Tube type: Oral Number of attempts: 1 Placement Confirmation: positive ETCO2 and breath sounds checked- equal and bilateral Tube secured with: Tape Dental Injury: Teeth and Oropharynx as per pre-operative assessment

## 2020-04-15 NOTE — Discharge Instructions (Signed)

## 2020-04-15 NOTE — ED Notes (Signed)
Pt to CT at this time.

## 2020-04-15 NOTE — ED Notes (Signed)
MD Veronese at bedside  

## 2020-04-15 NOTE — ED Notes (Signed)
Pt taken to OR at this time.

## 2020-04-16 ENCOUNTER — Telehealth: Payer: Self-pay

## 2020-04-16 ENCOUNTER — Encounter: Payer: Self-pay | Admitting: Obstetrics and Gynecology

## 2020-04-16 LAB — SURGICAL PATHOLOGY

## 2020-04-16 NOTE — Telephone Encounter (Signed)
Pt called in and stated that she was seen at the ED yesterday. The pt said that she was told she was having a miscarriage. The pt then had a DNE done there. The pt is saying that she has a pain on her right side she called the hospital and they advised her to follow up with her OBGYN. The pt was also told to follow up with [place] in 5 weeks, we made that appt. The pt is requesting a call back about her side pain. I told the pt I will send a message to the nurse and to please allow 24- 48 hours for a reply. Please advise

## 2020-04-16 NOTE — Telephone Encounter (Signed)
LM for patient to return call.

## 2020-04-19 NOTE — Telephone Encounter (Signed)
Patient stated that she is feeling better. I have scheduled her to come in next week for ED follow up.

## 2020-04-21 NOTE — Anesthesia Postprocedure Evaluation (Signed)
Anesthesia Post Note  Patient: Katherine Fitzgerald  Procedure(s) Performed: DILATATION AND EVACUATION (N/A )  Patient location during evaluation: PACU Anesthesia Type: General Level of consciousness: awake and alert Pain management: pain level controlled Vital Signs Assessment: post-procedure vital signs reviewed and stable Respiratory status: spontaneous breathing, nonlabored ventilation, respiratory function stable and patient connected to nasal cannula oxygen Cardiovascular status: blood pressure returned to baseline and stable Postop Assessment: no apparent nausea or vomiting Anesthetic complications: no   No complications documented.   Last Vitals:  Vitals:   04/15/20 1256 04/15/20 1403  BP: (!) 99/58 101/61  Pulse: 67 66  Resp: 14 14  Temp: 36.4 C   SpO2: 100% 100%    Last Pain:  Vitals:   04/16/20 1442  TempSrc:   PainSc: 10-Worst pain ever                 Yevette Edwards

## 2020-04-23 ENCOUNTER — Ambulatory Visit (INDEPENDENT_AMBULATORY_CARE_PROVIDER_SITE_OTHER): Payer: Medicaid Other | Admitting: Obstetrics and Gynecology

## 2020-04-23 ENCOUNTER — Encounter: Payer: Self-pay | Admitting: Obstetrics and Gynecology

## 2020-04-23 ENCOUNTER — Other Ambulatory Visit: Payer: Self-pay

## 2020-04-23 VITALS — BP 130/89 | HR 92 | Ht 62.0 in | Wt 121.3 lb

## 2020-04-23 DIAGNOSIS — Z9889 Other specified postprocedural states: Secondary | ICD-10-CM

## 2020-04-23 DIAGNOSIS — Z3009 Encounter for other general counseling and advice on contraception: Secondary | ICD-10-CM

## 2020-04-23 DIAGNOSIS — Z30013 Encounter for initial prescription of injectable contraceptive: Secondary | ICD-10-CM

## 2020-04-23 MED ORDER — MEDROXYPROGESTERONE ACETATE 150 MG/ML IM SUSP
150.0000 mg | Freq: Once | INTRAMUSCULAR | Status: AC
  Administered 2020-04-23: 150 mg via INTRAMUSCULAR

## 2020-04-23 MED ORDER — MEDROXYPROGESTERONE ACETATE 150 MG/ML IM SUSP: 150.0000 mg | INTRAMUSCULAR | 1 refills | Status: DC

## 2020-04-23 NOTE — Progress Notes (Signed)
HPI:      [name] is a 29 y.o. [phone] who LMP was No LMP recorded.  Subjective:   She presents today postop from D&E.  She says that her bleeding has stopped.  She would like to birth control.  She says she cannot remember to take pills and does not want an IUD.     Hx: The following portions of the patient's history were reviewed and updated as appropriate:             She  has a past medical history of Migraine and Spontaneous abortion (12/2013). She does not have any pertinent problems on file. She  has a past surgical history that includes Dilation and evacuation (N/A, 04/15/2020). Her family history is not on file. She  reports that she quit smoking about 5 years ago. She has never used smokeless tobacco. She reports that she does not drink alcohol and does not use drugs. She has a current medication list which includes the following prescription(s): sumatriptan and medroxyprogesterone. She has No Known Allergies.       Review of Systems:  Review of Systems  Constitutional: Denied constitutional symptoms, night sweats, recent illness, fatigue, fever, insomnia and weight loss.  Eyes: Denied eye symptoms, eye pain, photophobia, vision change and visual disturbance.  Ears/Nose/Throat/Neck: Denied ear, nose, throat or neck symptoms, hearing loss, nasal discharge, sinus congestion and sore throat.  Cardiovascular: Denied cardiovascular symptoms, arrhythmia, chest pain/pressure, edema, exercise intolerance, orthopnea and palpitations.  Respiratory: Denied pulmonary symptoms, asthma, pleuritic pain, productive sputum, cough, dyspnea and wheezing.  Gastrointestinal: Denied, gastro-esophageal reflux, melena, nausea and vomiting.  Genitourinary: Denied genitourinary symptoms including symptomatic vaginal discharge, pelvic relaxation issues, and urinary complaints.  Musculoskeletal: Denied musculoskeletal symptoms, stiffness, swelling, muscle weakness and myalgia.  Dermatologic:  Denied dermatology symptoms, rash and scar.  Neurologic: Denied neurology symptoms, dizziness, headache, neck pain and syncope.  Psychiatric: Denied psychiatric symptoms, anxiety and depression.  Endocrine: Denied endocrine symptoms including hot flashes and night sweats.   Meds:   Current Outpatient Medications on File Prior to Visit  Medication Sig Dispense Refill  . SUMAtriptan (IMITREX) 50 MG tablet Take 50 mg by mouth every 2 (two) hours as needed for migraine. May repeat in 2 hours if headache persists or recurs.     No current facility-administered medications on file prior to visit.     [facility] - 04/23/20 1521      Pregnancy Intention Screening   Does the patient want to become pregnant in the next year? No    Does the patient's partner want to become pregnant in the next year? No    Would the patient like to discuss contraceptive options today? Yes      Contraception Wrap Up   Current Method No Contraceptive Precautions    Contraception Counseling Provided Yes          The pregnancy intention screening data noted above was reviewed. Potential methods of contraception were discussed. The patient elected to proceed with Hormonal Injection.   Objective:     Vitals:   04/23/20 1518  BP: 130/89  Pulse: 92   Filed Weights   04/23/20 1518  Weight: 121 lb 4.8 oz (55 kg)              Pathology findings reviewed.  Assessment:    [id number] Patient Active Problem List   Diagnosis Date Noted  . Active labor at term 12/14/2014  . Labor and delivery, indication for care 12/13/2014  .  Pregnancy 12/03/2014  . Indication for care in labor and delivery, antepartum 12/03/2014     1. Post-operative state   2. Birth control counseling   3. Encounter for prescription for depo-Provera     Patient desires birth control.  Doing well postop   Plan:            1.  Patient may resume normal activities  2.  Birth Control I discussed multiple birth control options and  methods with the patient.  The risks and benefits of each were reviewed. Patient desires Depo-Provera  Orders No orders of the defined types were placed in this encounter.    Meds ordered this encounter  Medications  . medroxyPROGESTERone (DEPO-PROVERA) 150 MG/ML injection    Sig: Inject 1 mL (150 mg total) into the muscle every 3 (three) months.    Dispense:  1 mL    Refill:  1      F/U  Return in about 3 months (around 07/24/2020).  Elonda Husky, M.D. 04/23/2020 3:37 PM

## 2020-04-23 NOTE — Addendum Note (Signed)
Addended by: Dorian Pod on: 04/23/2020 03:45 PM   Modules accepted: Orders

## 2020-05-21 ENCOUNTER — Encounter: Payer: Self-pay | Admitting: Obstetrics and Gynecology

## 2020-07-19 NOTE — Progress Notes (Deleted)
Last depo inj: 04/23/20 UPT: N/A Side effects: none Next Depo- Provera injection due: 10/10/20-10/24/20 Annual exam due: past due- 2016

## 2020-07-24 ENCOUNTER — Ambulatory Visit: Payer: Medicaid Other

## 2020-07-25 ENCOUNTER — Ambulatory Visit: Payer: Medicaid Other

## 2020-07-26 ENCOUNTER — Ambulatory Visit: Payer: Medicaid Other

## 2020-09-10 ENCOUNTER — Encounter: Payer: Medicaid Other | Admitting: Obstetrics and Gynecology

## 2020-10-03 ENCOUNTER — Encounter: Payer: Self-pay | Admitting: Obstetrics and Gynecology

## 2020-10-07 ENCOUNTER — Other Ambulatory Visit: Payer: Self-pay

## 2020-10-07 ENCOUNTER — Encounter: Payer: Self-pay | Admitting: Obstetrics and Gynecology

## 2020-10-07 ENCOUNTER — Ambulatory Visit (INDEPENDENT_AMBULATORY_CARE_PROVIDER_SITE_OTHER): Payer: Medicaid Other | Admitting: Obstetrics and Gynecology

## 2020-10-07 VITALS — BP 124/79 | HR 66 | Ht 62.0 in | Wt 132.6 lb

## 2020-10-07 DIAGNOSIS — Z3200 Encounter for pregnancy test, result unknown: Secondary | ICD-10-CM

## 2020-10-07 DIAGNOSIS — N926 Irregular menstruation, unspecified: Secondary | ICD-10-CM

## 2020-10-07 LAB — POCT URINE PREGNANCY: Preg Test, Ur: NEGATIVE

## 2020-10-07 NOTE — Progress Notes (Signed)
HPI:      [name] is a 30 y.o. [phone] who LMP was Patient's last menstrual period was 08/30/2020.  Subjective:   She presents today after missing her menstrual period in March.  She thinks she may be pregnant. Vitamins.  She does take One-A-Day vitamins (twice per day) She also complains of constipation.  She states that she is having bowel movements every 3 days.  This is not usually normal for her. She discontinued her Depo-Provera in January and had a period in January and February but missed March. She is desirous of pregnancy.    Hx: The following portions of the patient's history were reviewed and updated as appropriate:             She  has a past medical history of Migraine and Spontaneous abortion (12/2013). She does not have any pertinent problems on file. She  has a past surgical history that includes Dilation and evacuation (N/A, 04/15/2020). Her Family history is unknown by patient. She  reports that she quit smoking about 6 years ago. She has never used smokeless tobacco. She reports that she does not drink alcohol and does not use drugs. She has a current medication list which includes the following prescription(s): sumatriptan. She has No Known Allergies.       Review of Systems:  Review of Systems  Constitutional: Denied constitutional symptoms, night sweats, recent illness, fatigue, fever, insomnia and weight loss.  Eyes: Denied eye symptoms, eye pain, photophobia, vision change and visual disturbance.  Ears/Nose/Throat/Neck: Denied ear, nose, throat or neck symptoms, hearing loss, nasal discharge, sinus congestion and sore throat.  Cardiovascular: Denied cardiovascular symptoms, arrhythmia, chest pain/pressure, edema, exercise intolerance, orthopnea and palpitations.  Respiratory: Denied pulmonary symptoms, asthma, pleuritic pain, productive sputum, cough, dyspnea and wheezing.  Gastrointestinal: Denied, gastro-esophageal reflux, melena, nausea and  vomiting.  Genitourinary: Denied genitourinary symptoms including symptomatic vaginal discharge, pelvic relaxation issues, and urinary complaints.  Musculoskeletal: Denied musculoskeletal symptoms, stiffness, swelling, muscle weakness and myalgia.  Dermatologic: Denied dermatology symptoms, rash and scar.  Neurologic: Denied neurology symptoms, dizziness, headache, neck pain and syncope.  Psychiatric: Denied psychiatric symptoms, anxiety and depression.  Endocrine: Denied endocrine symptoms including hot flashes and night sweats.   Meds:   Current Outpatient Medications on File Prior to Visit  Medication Sig Dispense Refill  . SUMAtriptan (IMITREX) 50 MG tablet Take 50 mg by mouth every 2 (two) hours as needed for migraine. May repeat in 2 hours if headache persists or recurs. (Patient not taking: Reported on 10/07/2020)     No current facility-administered medications on file prior to visit.       The pregnancy intention screening data noted above was reviewed. Potential methods of contraception were discussed. The patient elected to proceed with Pregnant/Seeking Pregnancy.     Objective:     Vitals:   10/07/20 0908  BP: 124/79  Pulse: 66   Filed Weights   10/07/20 0908  Weight: 132 lb 9.6 oz (60.1 kg)              Urine pregnancy test was negative  Assessment:    [id number] Patient Active Problem List   Diagnosis Date Noted  . Active labor at term 12/14/2014  . Labor and delivery, indication for care 12/13/2014  . Pregnancy 12/03/2014  . Indication for care in labor and delivery, antepartum 12/03/2014     1. Missed period   2. Possible pregnancy, not confirmed        Plan:  1.  Expectant management of menses.  After Depo expectation is that periods may be irregular up to 6 months.  2.  Citrucel/Metamucil-fiber laxatives discussed in detail for management of constipation.  All questions answered.  3.  Timing of intercourse and recommendations for  prenatal vitamins prior to pregnancy discussed.  Orders Orders Placed This Encounter  Procedures  . POCT urine pregnancy    No orders of the defined types were placed in this encounter.     F/U  Return for Annual Physical. I spent 22 minutes involved in the care of this patient preparing to see the patient by obtaining and reviewing her medical history (including labs, imaging tests and prior procedures), documenting clinical information in the electronic health record (EHR), counseling and coordinating care plans, writing and sending prescriptions, ordering tests or procedures and directly communicating with the patient by discussing pertinent items from her history and physical exam as well as detailing my assessment and plan as noted above so that she has an informed understanding.  All of her questions were answered.  Elonda Husky, M.D. 10/07/2020 9:32 AM

## 2020-10-29 ENCOUNTER — Encounter: Payer: Self-pay | Admitting: Obstetrics and Gynecology

## 2020-10-29 ENCOUNTER — Other Ambulatory Visit: Payer: Self-pay

## 2020-10-29 ENCOUNTER — Ambulatory Visit (INDEPENDENT_AMBULATORY_CARE_PROVIDER_SITE_OTHER): Payer: Medicaid Other | Admitting: Obstetrics and Gynecology

## 2020-10-29 ENCOUNTER — Other Ambulatory Visit (HOSPITAL_COMMUNITY)
Admission: RE | Admit: 2020-10-29 | Discharge: 2020-10-29 | Disposition: A | Discharge status: Home / Self Care | Payer: Medicaid Other | Source: Ambulatory Visit | Attending: Obstetrics and Gynecology | Admitting: Obstetrics and Gynecology

## 2020-10-29 VITALS — BP 118/86 | HR 73 | Ht 62.0 in | Wt 135.3 lb

## 2020-10-29 DIAGNOSIS — R102 Pelvic and perineal pain unspecified side: Secondary | ICD-10-CM

## 2020-10-29 DIAGNOSIS — Z124 Encounter for screening for malignant neoplasm of cervix: Secondary | ICD-10-CM

## 2020-10-29 DIAGNOSIS — N941 Unspecified dyspareunia: Secondary | ICD-10-CM

## 2020-10-29 DIAGNOSIS — Z01419 Encounter for gynecological examination (general) (routine) without abnormal findings: Secondary | ICD-10-CM

## 2020-10-29 NOTE — Progress Notes (Signed)
HPI:      [name] is a 30 y.o. [phone] who LMP was Patient's last menstrual period was 10/07/2020.  Subjective:   She presents today for her annual examination.  She states that she has begun to have right-sided pelvic pain especially with intercourse.  This occurs with deep penetration.  She still has not returned to normal cycles after Depo-Provera.  She would like to be pregnant and is attempting at this time.  Have recommended prenatal vitamins. She states that she has begun to have some mild pelvic cramping over the last few days and thinks she is about to get her menstrual period.    Hx: The following portions of the patient's history were reviewed and updated as appropriate:             She  has a past medical history of Migraine and Spontaneous abortion (12/2013). She does not have any pertinent problems on file. She  has a past surgical history that includes Dilation and evacuation (N/A, 04/15/2020). Her Family history is unknown by patient. She  reports that she quit smoking about 6 years ago. She has never used smokeless tobacco. She reports that she does not drink alcohol and does not use drugs. She has a current medication list which includes the following prescription(s): sumatriptan. She has No Known Allergies.       Review of Systems:  Review of Systems  Constitutional: Denied constitutional symptoms, night sweats, recent illness, fatigue, fever, insomnia and weight loss.  Eyes: Denied eye symptoms, eye pain, photophobia, vision change and visual disturbance.  Ears/Nose/Throat/Neck: Denied ear, nose, throat or neck symptoms, hearing loss, nasal discharge, sinus congestion and sore throat.  Cardiovascular: Denied cardiovascular symptoms, arrhythmia, chest pain/pressure, edema, exercise intolerance, orthopnea and palpitations.  Respiratory: Denied pulmonary symptoms, asthma, pleuritic pain, productive sputum, cough, dyspnea and wheezing.  Gastrointestinal: Denied,  gastro-esophageal reflux, melena, nausea and vomiting.  Genitourinary: Denied genitourinary symptoms including symptomatic vaginal discharge, pelvic relaxation issues, and urinary complaints.  Musculoskeletal: Denied musculoskeletal symptoms, stiffness, swelling, muscle weakness and myalgia.  Dermatologic: Denied dermatology symptoms, rash and scar.  Neurologic: Denied neurology symptoms, dizziness, headache, neck pain and syncope.  Psychiatric: Denied psychiatric symptoms, anxiety and depression.  Endocrine: Denied endocrine symptoms including hot flashes and night sweats.   Meds:   Current Outpatient Medications on File Prior to Visit  Medication Sig Dispense Refill  . SUMAtriptan (IMITREX) 50 MG tablet Take 50 mg by mouth every 2 (two) hours as needed for migraine. May repeat in 2 hours if headache persists or recurs.     No current facility-administered medications on file prior to visit.       The pregnancy intention screening data noted above was reviewed. Potential methods of contraception were discussed. The patient elected to proceed with Pregnant/Seeking Pregnancy.     Objective:     Vitals:   10/29/20 1431  BP: 118/86  Pulse: 73    Filed Weights   10/29/20 1431  Weight: 135 lb 4.8 oz (61.4 kg)              Physical examination General NAD, Conversant  HEENT Atraumatic; Op clear with mmm.  Normo-cephalic. Pupils reactive. Anicteric sclerae  Thyroid/Neck Smooth without nodularity or enlargement. Normal ROM.  Neck Supple.  Skin No rashes, lesions or ulceration. Normal palpated skin turgor. No nodularity.  Breasts: No masses or discharge.  Symmetric.  No axillary adenopathy.  Lungs: Clear to auscultation.No rales or wheezes. Normal Respiratory effort, no retractions.  Heart: NSR.  No murmurs or rubs appreciated. No periferal edema  Abdomen: Soft.  Non-tender.  No masses.  No HSM. No hernia  Extremities: Moves all appropriately.  Normal ROM for age. No  lymphadenopathy.  Neuro: Oriented to PPT.  Normal mood. Normal affect.     Pelvic:   Vulva: Normal appearance.  No lesions.  Vagina: No lesions or abnormalities noted.  Support: Normal pelvic support.  Urethra No masses tenderness or scarring.  Meatus Normal size without lesions or prolapse.  Cervix: Normal appearance.  No lesions.  Anus: Normal exam.  No lesions.  Perineum: Normal exam.  No lesions.        Bimanual   Uterus: Normal size.  Non-tender.  Mobile.  AV.  Adnexae: No masses.  Some right-sided tenderness with exam  Cul-de-sac: Negative for abnormality.      Assessment:    [id number] Patient Active Problem List   Diagnosis Date Noted  . Active labor at term 12/14/2014  . Labor and delivery, indication for care 12/13/2014  . Pregnancy 12/03/2014  . Indication for care in labor and delivery, antepartum 12/03/2014     1. Well woman exam with routine gynecological exam   2. Screening for cervical cancer   3. Pelvic pain in female   4. Dyspareunia in female        Plan:            1.  Basic Screening Recommendations The basic screening recommendations for asymptomatic women were discussed with the patient during her visit.  The age-appropriate recommendations were discussed with her and the rational for the tests reviewed.  When I am informed by the patient that another primary care physician has previously obtained the age-appropriate tests and they are up-to-date, only outstanding tests are ordered and referrals given as necessary.  Abnormal results of tests will be discussed with her when all of her results are completed.  Routine preventative health maintenance measures emphasized: Exercise/Diet/Weight control, Tobacco Warnings, Alcohol/Substance use risks and Stress Management Pap performed 2.  Pelvic ultrasound scheduled for right-sided pelvic pain/dyspareunia 3.  Expect menses to return by June.  If she is not having regular menses bulging consider cycling with  [facility] for short time. Orders Orders Placed This Encounter  Procedures  . US PELVIS TRANSVAGINAL NON-OB (TV ONLY)  . US PELVIS (TRANSABDOMINAL ONLY)    No orders of the defined types were placed in this encounter.         F/U  Return in about 1 year (around 10/29/2021) for Annual Physical.  Elonda Husky, M.D. 10/29/2020 3:01 PM

## 2020-11-01 LAB — CYTOLOGY - PAP: Diagnosis: NEGATIVE

## 2020-11-16 ENCOUNTER — Other Ambulatory Visit: Payer: Self-pay

## 2020-11-16 ENCOUNTER — Emergency Department: Payer: Medicaid Other

## 2020-11-16 ENCOUNTER — Emergency Department
Admission: EM | Admit: 2020-11-16 | Discharge: 2020-11-16 | Disposition: A | Discharge status: Home / Self Care | Payer: Medicaid Other | Attending: Emergency Medicine | Admitting: Emergency Medicine

## 2020-11-16 DIAGNOSIS — U071 COVID-19: Secondary | ICD-10-CM | POA: Diagnosis not present

## 2020-11-16 DIAGNOSIS — R1032 Left lower quadrant pain: Secondary | ICD-10-CM | POA: Diagnosis present

## 2020-11-16 DIAGNOSIS — Z87891 Personal history of nicotine dependence: Secondary | ICD-10-CM | POA: Diagnosis not present

## 2020-11-16 DIAGNOSIS — R102 Pelvic and perineal pain: Secondary | ICD-10-CM | POA: Insufficient documentation

## 2020-11-16 DIAGNOSIS — R110 Nausea: Secondary | ICD-10-CM | POA: Insufficient documentation

## 2020-11-16 LAB — CBC
HCT: 35 % — ABNORMAL LOW (ref 36.0–46.0)
Hemoglobin: 11.2 g/dL — ABNORMAL LOW (ref 12.0–15.0)
MCH: 25 pg — ABNORMAL LOW (ref 26.0–34.0)
MCHC: 32 g/dL (ref 30.0–36.0)
MCV: 78.1 fL — ABNORMAL LOW (ref 80.0–100.0)
Platelets: 240 10*3/uL (ref 150–400)
RBC: 4.48 MIL/uL (ref 3.87–5.11)
RDW: 17.5 % — ABNORMAL HIGH (ref 11.5–15.5)
WBC: 5.6 10*3/uL (ref 4.0–10.5)
nRBC: 0 % (ref 0.0–0.2)

## 2020-11-16 LAB — RESP PANEL BY RT-PCR (FLU A&B, COVID) ARPGX2
Influenza A by PCR: NEGATIVE
Influenza B by PCR: NEGATIVE
SARS Coronavirus 2 by RT PCR: POSITIVE — AB

## 2020-11-16 LAB — URINE DRUG SCREEN, QUALITATIVE (ARMC ONLY)
Amphetamines, Ur Screen: NOT DETECTED
Barbiturates, Ur Screen: NOT DETECTED
Benzodiazepine, Ur Scrn: NOT DETECTED
Cannabinoid 50 Ng, Ur ~~LOC~~: NOT DETECTED
Cocaine Metabolite,Ur ~~LOC~~: NOT DETECTED
MDMA (Ecstasy)Ur Screen: NOT DETECTED
Methadone Scn, Ur: NOT DETECTED
Opiate, Ur Screen: NOT DETECTED
Phencyclidine (PCP) Ur S: NOT DETECTED
Tricyclic, Ur Screen: NOT DETECTED

## 2020-11-16 LAB — URINALYSIS, COMPLETE (UACMP) WITH MICROSCOPIC
Bacteria, UA: NONE SEEN
Bilirubin Urine: NEGATIVE
Glucose, UA: NEGATIVE mg/dL
Hgb urine dipstick: NEGATIVE
Ketones, ur: 5 mg/dL — AB
Leukocytes,Ua: NEGATIVE
Nitrite: NEGATIVE
Protein, ur: NEGATIVE mg/dL
Specific Gravity, Urine: 1.017 (ref 1.005–1.030)
pH: 9 — ABNORMAL HIGH (ref 5.0–8.0)

## 2020-11-16 LAB — COMPREHENSIVE METABOLIC PANEL
ALT: 13 U/L (ref 0–44)
AST: 26 U/L (ref 15–41)
Albumin: 4.3 g/dL (ref 3.5–5.0)
Alkaline Phosphatase: 74 U/L (ref 38–126)
Anion gap: 10 (ref 5–15)
BUN: 11 mg/dL (ref 6–20)
CO2: 19 mmol/L — ABNORMAL LOW (ref 22–32)
Calcium: 9 mg/dL (ref 8.9–10.3)
Chloride: 107 mmol/L (ref 98–111)
Creatinine, Ser: 0.64 mg/dL (ref 0.44–1.00)
GFR, Estimated: >60 mL/min (ref >60)
Glucose, Bld: 100 mg/dL — ABNORMAL HIGH (ref 70–99)
Potassium: 3.6 mmol/L (ref 3.5–5.1)
Sodium: 136 mmol/L (ref 135–145)
Total Bilirubin: 0.7 mg/dL (ref 0.3–1.2)
Total Protein: 7.5 g/dL (ref 6.5–8.1)

## 2020-11-16 LAB — PREGNANCY, URINE: Preg Test, Ur: NEGATIVE

## 2020-11-16 LAB — LIPASE, BLOOD: Lipase: 23 U/L (ref 11–51)

## 2020-11-16 LAB — ETHANOL: Alcohol, Ethyl (B): <10 mg/dL (ref <10)

## 2020-11-16 MED ORDER — MORPHINE SULFATE (PF) 4 MG/ML IV SOLN
4.0000 mg | Freq: Once | INTRAVENOUS | Status: AC
  Administered 2020-11-16: 4 mg via INTRAVENOUS
  Filled 2020-11-16: qty 1

## 2020-11-16 MED ORDER — KETOROLAC TROMETHAMINE 30 MG/ML IJ SOLN
15.0000 mg | Freq: Once | INTRAMUSCULAR | Status: AC
  Administered 2020-11-16: 15 mg via INTRAVENOUS
  Filled 2020-11-16: qty 1

## 2020-11-16 MED ORDER — LACTATED RINGERS IV BOLUS
1000.0000 mL | Freq: Once | INTRAVENOUS | Status: AC
  Administered 2020-11-16: 1000 mL via INTRAVENOUS

## 2020-11-16 NOTE — ED Provider Notes (Signed)
[facility] Emergency Department Provider Note  ____________________________________________   Event Date/Time   First MD Initiated Contact with Patient 11/16/20 1024     (approximate)  I have reviewed the triage vital signs and the nursing notes.   HISTORY  Chief Complaint Generalized Body Aches   HPI Katherine Fitzgerald is a 30 y.o. 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  Patient endorses little nausea but has not had any fevers, chills, cough, chest pain, headache, earache, sore throat, vomiting, diarrhea, dysuria, rash, burning with urination, bleeding or abnormal discharge or any recent traumatic injuries.  She endorses drinkable margaritas last night and took an Excedrin this morning but this did not help.  She is not a regular alcohol drinker.  No other associated symptoms.  No other acute concerns at this time.         Past Medical History:  Diagnosis Date  . Migraine   . Spontaneous abortion 12/2013    Patient Active Problem List   Diagnosis Date Noted  . Active labor at term 12/14/2014  . Labor and delivery, indication for care 12/13/2014  . Pregnancy 12/03/2014  . Indication for care in labor and delivery, antepartum 12/03/2014    Past Surgical History:  Procedure Laterality Date  . DILATION AND EVACUATION N/A 04/15/2020   Procedure: DILATATION AND EVACUATION;  Surgeon: Linzie CollinEvans, David James, MD;  Location: [facility] ORS;  Service: Gynecology;  Laterality: N/A;    Prior to Admission medications   Medication Sig Start Date End Date Taking? Authorizing Provider  SUMAtriptan (IMITREX) 50 MG tablet Take  50 mg by mouth every 2 (two) hours as needed for migraine. May repeat in 2 hours if headache persists or recurs.    [provider]    Allergies Patient has no known allergies.  Family History  Family history unknown: Yes    Social History Social History   Tobacco Use  . Smoking status: Former Smoker    Quit date: 09/04/2014    Years since quitting: 6.2  . Smokeless tobacco: Never Used  Substance Use Topics  . Alcohol use: Yes  . Drug use: Not Currently    Types: Cocaine    Review of Systems  Review of Systems  Constitutional: Negative for chills and fever.  HENT: Negative for sore throat.   Eyes: Negative for pain.  Respiratory: Negative for cough and stridor.   Cardiovascular: Negative for chest pain.  Gastrointestinal: Positive for abdominal pain ( LLQ). Negative for vomiting.  Musculoskeletal: Positive for back pain and myalgias.  Skin: Negative for rash.  Neurological: Negative for seizures, loss of consciousness and headaches.  Psychiatric/Behavioral: Negative for suicidal ideas.  All other systems reviewed and are negative.     ____________________________________________   PHYSICAL EXAM:  VITAL SIGNS: ED Triage Vitals  Enc Vitals Group     BP 11/16/20 1000 (!) 122/57     Pulse Rate 11/16/20 1000 70     Resp 11/16/20 1000 (!) 24     Temp 11/16/20 1000 98.9 F (37.2 C)     Temp Source 11/16/20 1000 Oral     SpO2 11/16/20 1000 98 %  Weight 11/16/20 0957 134 lb (60.8 kg)     Height 11/16/20 0957 5\' 2"  (1.575 m)     Head Circumference --      Peak Flow --      Pain Score 11/16/20 0957 10     Pain Loc --      Pain Edu? --      Excl. in GC? --    Vitals:   11/16/20 1000  BP: (!) 122/57  Pulse: 70  Resp: (!) 24  Temp: 98.9 F (37.2 C)  SpO2: 98%   Physical Exam Vitals and nursing note reviewed.  Constitutional:      General: She is not in acute distress.    Appearance: She is well-developed. She is ill-appearing.  HENT:      Head: Normocephalic and atraumatic.     Right Ear: External ear normal.     Left Ear: External ear normal.     Nose: Nose normal.     Mouth/Throat:     Mouth: Mucous membranes are dry.  Eyes:     Conjunctiva/sclera: Conjunctivae normal.  Cardiovascular:     Rate and Rhythm: Normal rate and regular rhythm.     Heart sounds: No murmur heard.   Pulmonary:     Effort: Pulmonary effort is normal. No respiratory distress.     Breath sounds: Normal breath sounds.  Abdominal:     Palpations: Abdomen is soft.     Tenderness: There is abdominal tenderness. There is right CVA tenderness and left CVA tenderness.  Musculoskeletal:     Cervical back: Neck supple.  Skin:    General: Skin is warm and dry.     Capillary Refill: Capillary refill takes 2 to 3 seconds.  Neurological:     General: No focal deficit present.     Mental Status: She is alert.  Psychiatric:        Mood and Affect: Mood normal.     No focal deficits.  Patient appears quite uncomfortable and is rolling back and forth tearful initial interview.  She seems to be tender in both CVA areas and mildly tender in the left lower quadrant of her abdomen.  2+ radial pulses.  Extremities otherwise unremarkable and are warm and well-perfused.  Slightly decreased cap refill in extremities. ____________________________________________   LABS (all labs ordered are listed, but only abnormal results are displayed)  Labs Reviewed  RESP PANEL BY RT-PCR (FLU A&B, COVID) ARPGX2 - Abnormal; Notable for the following components:      Result Value   SARS Coronavirus 2 by RT PCR POSITIVE (*)    All other components within normal limits  COMPREHENSIVE METABOLIC PANEL - Abnormal; Notable for the following components:   CO2 19 (*)    Glucose, Bld 100 (*)    All other components within normal limits  CBC - Abnormal; Notable for the following components:   Hemoglobin 11.2 (*)    HCT 35.0 (*)    MCV 78.1 (*)    MCH 25.0 (*)    RDW 17.5 (*)     All other components within normal limits  URINALYSIS, COMPLETE (UACMP) WITH MICROSCOPIC - Abnormal; Notable for the following components:   Color, Urine YELLOW (*)    APPearance CLEAR (*)    pH 9.0 (*)    Ketones, ur 5 (*)    All other components within normal limits  LIPASE, BLOOD  ETHANOL  URINE DRUG SCREEN, QUALITATIVE ([facility] ONLY)  PREGNANCY, URINE  POC URINE PREG, ED  ____________________________________________  EKG  ____________________________________________  RADIOLOGY  ED MD interpretation: No evidence of ovarian torsion, abscess or other clear acute abdominal pelvic process.  Official radiology report(s): US PELVIC COMPLETE W TRANSVAGINAL AND TORSION R/O  Result Date: 11/16/2020 CLINICAL DATA:  Pelvic pain for 1 day. EXAM: TRANSABDOMINAL AND TRANSVAGINAL ULTRASOUND OF PELVIS DOPPLER ULTRASOUND OF OVARIES TECHNIQUE: Both transabdominal and transvaginal ultrasound examinations of the pelvis were performed. Transabdominal technique was performed for global imaging of the pelvis including uterus, ovaries, adnexal regions, and pelvic cul-de-sac. It was necessary to proceed with endovaginal exam following the transabdominal exam to visualize the adnexal structures to an adequate degree. Color and duplex Doppler ultrasound was utilized to evaluate blood flow to the ovaries. COMPARISON:  None. FINDINGS: Uterus Measurements: 7.8 x 5.7 x 6.5 cm = volume: 151 mL. No fibroids or other mass visualized. Endometrium Thickness: 8 mm.  No focal abnormality visualized. Right ovary Measurements: 3 x 2.6 x 2.5 cm = volume: 10.1 mL. RIGHT ovary contains a normal dominant follicle that measures 2.2 cm. Left ovary Measurements: 2.1 x 1.6 x 1.9 cm = volume: 3.4 mL. Normal appearance/no adnexal mass. Pulsed Doppler evaluation of both ovaries demonstrates normal low-resistance arterial and venous waveforms. Other findings No abnormal free fluid. IMPRESSION: Normal pelvic ultrasound. No evidence of  ovarian torsion. No mass or free fluid within either adnexal region. Electronically Signed   By: Bary Richard M.D.   On: 11/16/2020 12:15    ____________________________________________   PROCEDURES  Procedure(s) performed (including Critical Care):  Procedures   ____________________________________________   INITIAL IMPRESSION / ASSESSMENT AND PLAN / ED COURSE      Patient presents with above to history exam for assessment of some bilateral lower back pain left lower quad abdominal pain with myalgias that started this morning after having couple margaritas last night.  No associated trauma or injuries.  No other acute associated symptoms.  On arrival she is tachypneic but otherwise stable vital signs.  She appears very uncomfortable on arrival is rocking back and forth in her stretcher in tears.  She is a little bit tenderness in left lower quadrant and bilateral CVA areas.  Differential includes possible acute viral syndrome, ovarian torsion, symptomatic cyst, cystitis, kidney stone, possible hangover effect.  We will also check for metabolic derangements.  Lower suspicion for acute pancreatitis.  Epigastric pain nausea vomiting or diarrhea.  Lipase does not consistent with acute pancreatitis.  CMP shows no significant electrode or metabolic derangements aside from bicarb of 19.  CBC shows no leukocytosis or acute anemia.  Ethanol is undetectable.  Ultrasound shows no evidence of torsion or other acute pelvic process.  UA has no evidence of infection.  Suspect symptoms likely related to COVID infection as COVID PCR is positive.  Reassessment after analgesia and fluids patient states she felt much better.  Given stable vitals laceration exam work-up and source for patient's myalgias back pain COVID I think is safe for discharge outpatient follow-up.  Discharged stable condition.  Strict return precautions advised and discussed.  Referral placed for COVID clinic.         ____________________________________________   FINAL CLINICAL IMPRESSION(S) / ED DIAGNOSES  Final diagnoses:  Pelvic pain  COVID    Medications  lactated ringers bolus 1,000 mL (0 mLs Intravenous Stopped 11/16/20 1210)  ketorolac (TORADOL) 30 MG/ML injection 15 mg (15 mg Intravenous Given 11/16/20 1044)  morphine 4 MG/ML injection 4 mg (4 mg Intravenous Given 11/16/20 1043)     ED Discharge Orders  Ordered    Ambulatory referral for Covid Treatment        11/16/20 1233           Note:  This document was prepared using Dragon voice recognition software and may include unintentional dictation errors.   Gilles Chiquito, MD 11/16/20 1239

## 2020-11-16 NOTE — ED Notes (Signed)
Patient provided with gingerale.

## 2020-11-16 NOTE — ED Triage Notes (Signed)
Pt to ED for headache and body aches after drinking 4 margaritas last night, states she is a couple month sober from cocaine. Does not believe sx are related to ETOH .  Pt moaning, unable to sit still in chair, crying.

## 2020-11-17 ENCOUNTER — Telehealth: Payer: Self-pay | Admitting: Family

## 2020-11-17 NOTE — Telephone Encounter (Signed)
Called to discuss with patient about COVID-19 symptoms and the use of one of the available treatments for those with mild to moderate Covid symptoms and at a high risk of hospitalization.  Pt appears to qualify for outpatient treatment due to co-morbid conditions and/or a member of an at-risk group in accordance with the FDA Emergency Use Authorization.    Symptom onset: 4/30 Vaccinated:  Booster?  Immunocompromised? No Qualifiers: Substance use disorder NIH Criteria:  Katherine Fitzgerald was seen at [facility] ED with acute onset body aches and abdominal pain. Found to be positive for Covid. Attempted to contact Katherine Fitzgerald and was unable to reach her via phone. Appears to be a good candidate for Paxlovid. Voicemail and MyChart sent.   Marcos Eke, NP 11/17/2020 11:29 AM

## 2020-11-19 ENCOUNTER — Other Ambulatory Visit: Payer: Self-pay

## 2020-11-19 ENCOUNTER — Ambulatory Visit
Admission: RE | Admit: 2020-11-19 | Discharge: 2020-11-19 | Disposition: A | Discharge status: Home / Self Care | Payer: Medicaid Other | Source: Ambulatory Visit | Attending: Obstetrics and Gynecology | Admitting: Obstetrics and Gynecology

## 2020-11-19 DIAGNOSIS — R102 Pelvic and perineal pain: Secondary | ICD-10-CM

## 2020-11-21 ENCOUNTER — Encounter: Payer: Self-pay | Admitting: Obstetrics and Gynecology

## 2020-11-21 ENCOUNTER — Ambulatory Visit (INDEPENDENT_AMBULATORY_CARE_PROVIDER_SITE_OTHER): Payer: Medicaid Other | Admitting: Obstetrics and Gynecology

## 2020-11-21 ENCOUNTER — Other Ambulatory Visit: Payer: Self-pay

## 2020-11-21 NOTE — Progress Notes (Signed)
HPI:      [name] is a 30 y.o. [phone] who LMP was Patient's last menstrual period was 11/01/2020.  Subjective:   She presents today after being diagnosed with COVID-positive in the emergency department 5 days ago. I have explained to her that we are trying to see COVID-positive patients in the office because of her high risk obstetric patients and that her screening process should have allowed [place] to reschedule her visit. She explained that she understood and I have asked her to reschedule her appointment.

## 2020-12-19 ENCOUNTER — Telehealth: Payer: Self-pay | Admitting: Obstetrics and Gynecology

## 2020-12-19 ENCOUNTER — Other Ambulatory Visit: Payer: Self-pay | Admitting: Obstetrics and Gynecology

## 2020-12-19 DIAGNOSIS — N3000 Acute cystitis without hematuria: Secondary | ICD-10-CM

## 2020-12-19 MED ORDER — NITROFURANTOIN MONOHYD MACRO 100 MG PO CAPS: 100.0000 mg | ORAL_CAPSULE | Freq: Two times a day (BID) | ORAL | 1 refills | Status: DC

## 2020-12-19 NOTE — Telephone Encounter (Signed)
Notified patient that macrobid has been sent to the pharmacy.

## 2020-12-19 NOTE — Telephone Encounter (Signed)
Please schedule patient for an appointment for today. She can come in at 11or 12. She need to be seen to treat these symptoms.

## 2020-12-19 NOTE — Telephone Encounter (Signed)
Pt called requesting RX for UTI, pt states symptoms started yesterday . Symptoms include itching, burning and constant urge to urinate. Confirmed pharmacy as [facility] in [place]. Please Advise.

## 2021-02-19 ENCOUNTER — Encounter: Payer: Self-pay | Admitting: Obstetrics and Gynecology

## 2021-02-19 ENCOUNTER — Encounter: Payer: Medicaid Other | Admitting: Obstetrics and Gynecology

## 2021-03-05 ENCOUNTER — Other Ambulatory Visit: Payer: Self-pay

## 2021-03-05 ENCOUNTER — Emergency Department
Admission: EM | Admit: 2021-03-05 | Discharge: 2021-03-05 | Disposition: A | Discharge status: Home / Self Care | Payer: Medicaid Other | Attending: Emergency Medicine | Admitting: Emergency Medicine

## 2021-03-05 DIAGNOSIS — N939 Abnormal uterine and vaginal bleeding, unspecified: Secondary | ICD-10-CM | POA: Diagnosis not present

## 2021-03-05 DIAGNOSIS — R102 Pelvic and perineal pain: Secondary | ICD-10-CM | POA: Diagnosis not present

## 2021-03-05 DIAGNOSIS — Z87891 Personal history of nicotine dependence: Secondary | ICD-10-CM | POA: Insufficient documentation

## 2021-03-05 LAB — CBC
HCT: 39.3 % (ref 36.0–46.0)
Hemoglobin: 12.9 g/dL (ref 12.0–15.0)
MCH: 28.1 pg (ref 26.0–34.0)
MCHC: 32.8 g/dL (ref 30.0–36.0)
MCV: 85.6 fL (ref 80.0–100.0)
Platelets: 272 10*3/uL (ref 150–400)
RBC: 4.59 MIL/uL (ref 3.87–5.11)
RDW: 15.4 % (ref 11.5–15.5)
WBC: 5.2 10*3/uL (ref 4.0–10.5)
nRBC: 0 % (ref 0.0–0.2)

## 2021-03-05 LAB — COMPREHENSIVE METABOLIC PANEL
ALT: 12 U/L (ref 0–44)
AST: 19 U/L (ref 15–41)
Albumin: 4 g/dL (ref 3.5–5.0)
Alkaline Phosphatase: 63 U/L (ref 38–126)
Anion gap: 6 (ref 5–15)
BUN: 15 mg/dL (ref 6–20)
CO2: 26 mmol/L (ref 22–32)
Calcium: 9 mg/dL (ref 8.9–10.3)
Chloride: 105 mmol/L (ref 98–111)
Creatinine, Ser: 0.59 mg/dL (ref 0.44–1.00)
GFR, Estimated: >60 mL/min (ref >60)
Glucose, Bld: 93 mg/dL (ref 70–99)
Potassium: 4.2 mmol/L (ref 3.5–5.1)
Sodium: 137 mmol/L (ref 135–145)
Total Bilirubin: 0.6 mg/dL (ref 0.3–1.2)
Total Protein: 7 g/dL (ref 6.5–8.1)

## 2021-03-05 LAB — POC URINE PREG, ED: Preg Test, Ur: NEGATIVE

## 2021-03-05 LAB — HCG, QUANTITATIVE, PREGNANCY: hCG, Beta Chain, Quant, S: <1 m[IU]/mL (ref <5)

## 2021-03-05 NOTE — Discharge Instructions (Addendum)
Follow-up with your regular doctor if not improving to 3 days.  Return if worsening

## 2021-03-05 NOTE — ED Provider Notes (Signed)
[facility] Emergency Department Provider Note  ____________________________________________   Event Date/Time   First MD Initiated Contact with Patient 03/05/21 1156     (approximate)  I have reviewed the triage vital signs and the nursing notes.   HISTORY  Chief Complaint Vaginal Bleeding    HPI Katherine Fitzgerald is a 30 y.o. female [place] emergency department complaining of pelvic pain and bleeding.  Painful intercourse.  Patient took pregnancy test which was +1-week ago.  Started having spotting and bleeding last night.  Denies any fever or chills.  Past Medical History:  Diagnosis Date   Migraine    Spontaneous abortion 12/2013    Patient Active Problem List   Diagnosis Date Noted   Active labor at term 12/14/2014   Labor and delivery, indication for care 12/13/2014   Pregnancy 12/03/2014   Indication for care in labor and delivery, antepartum 12/03/2014    Past Surgical History:  Procedure Laterality Date   DILATION AND EVACUATION N/A 04/15/2020   Procedure: DILATATION AND EVACUATION;  Surgeon: Linzie Collin, MD;  Location: [facility] ORS;  Service: Gynecology;  Laterality: N/A;    Prior to Admission medications   Medication Sig Start Date End Date Taking? Authorizing Provider  nitrofurantoin, macrocrystal-monohydrate, (MACROBID) 100 MG capsule Take 1 capsule (100 mg total) by mouth 2 (two) times daily. 12/19/20   Linzie Collin, MD  SUMAtriptan (IMITREX) 50 MG tablet Take 50 mg by mouth every 2 (two) hours as needed for migraine. May repeat in 2 hours if headache persists or recurs. Patient not taking: Reported on 11/21/2020    [provider]    Allergies Patient has no known allergies.  Family History  Family history unknown: Yes    Social History Social History   Tobacco Use   Smoking status: Former    Types: Cigarettes    Quit date: 09/04/2014    Years since quitting: 6.5   Smokeless tobacco: Never  Substance  Use Topics   Alcohol use: Not Currently   Drug use: Not Currently    Types: Cocaine    Review of Systems  Constitutional: No fever/chills Eyes: No visual changes. ENT: No sore throat. Respiratory: Denies cough Cardiovascular: Denies chest pain Gastrointestinal: Denies abdominal pain Genitourinary: Negative for dysuria.  Positive vaginal bleeding, denies vaginal discharge Musculoskeletal: Negative for back pain. Skin: Negative for rash. Psychiatric: no mood changes,     ____________________________________________   PHYSICAL EXAM:  VITAL SIGNS: ED Triage Vitals  Enc Vitals Group     BP 03/05/21 1015 111/75     Pulse Rate 03/05/21 1015 68     Resp 03/05/21 1015 16     Temp 03/05/21 1015 98.2 F (36.8 C)     Temp Source 03/05/21 1015 Oral     SpO2 03/05/21 1015 100 %     Weight 03/05/21 1016 140 lb (63.5 kg)     Height 03/05/21 1016 5\' 2"  (1.575 m)     Head Circumference --      Peak Flow --      Pain Score 03/05/21 1016 6     Pain Loc --      Pain Edu? --      Excl. in GC? --     Constitutional: Alert and oriented. Well appearing and in no acute distress. Eyes: Conjunctivae are normal.  Head: Atraumatic. Nose: No congestion/rhinnorhea. Mouth/Throat: Mucous membranes are moist.   Neck:  supple no lymphadenopathy noted Cardiovascular: Normal rate, regular rhythm. Heart sounds  are normal Respiratory: Normal respiratory effort.  No retractions, lungs c t a  Abd: soft tender in the left lower quadrant, bs normal all 4 quad GU: deferred Musculoskeletal: FROM all extremities, warm and well perfused Neurologic:  Normal speech and language.  Skin:  Skin is warm, dry and intact. No rash noted. Psychiatric: Mood and affect are normal. Speech and behavior are normal.  ____________________________________________   LABS (all labs ordered are listed, but only abnormal results are displayed)  Labs Reviewed  CBC  COMPREHENSIVE METABOLIC PANEL  HCG, QUANTITATIVE,  PREGNANCY  POC URINE PREG, ED   ____________________________________________   ____________________________________________  RADIOLOGY  Ultrasound pelvis  ____________________________________________   PROCEDURES  Procedure(s) performed: No  Procedures    ____________________________________________   INITIAL IMPRESSION / ASSESSMENT AND PLAN / ED COURSE  Pertinent labs & imaging results that were available during my care of the patient were reviewed by me and considered in my medical decision making (see chart for details).   Patient is a 30 year old female presents with pelvic pain.  Patient had positive pregnancy test last week but started bleeding and cramping overnight.  Patient is in a monogamous relationship.  Had a miscarriage 1 year ago.  DDx: Miscarriage, ectopic pregnancy, ovarian cyst  POC pregnancy and beta hCG are both negative for pregnancy, CBC and metabolic panel are normal  Patient has great concerns about ovarian cyst.  Will do pelvic ultrasound  ----------------------------------------- 12:55 PM on 03/05/2021 ----------------------------------------- Patient came to the door and states she is ready to go home and does not want to wait for the ultrasound.  Since she is not pregnant I do not think it is highly important that she does this today.  She can follow-up with her regular doctor to be assessed for ovarian cyst.  Return emergency department worsening.  She is discharged stable condition.    Katherine Fitzgerald was evaluated in Emergency Department on 03/05/2021 for the symptoms described in the history of present illness. She was evaluated in the context of the global COVID-19 pandemic, which necessitated consideration that the patient might be at risk for infection with the SARS-CoV-2 virus that causes COVID-19. Institutional protocols and algorithms that pertain to the evaluation of patients at risk for COVID-19 are in a state of rapid change based  on information released by regulatory bodies including the CDC and federal and state organizations. These policies and algorithms were followed during the patient's care in the ED.    As part of my medical decision making, I reviewed the following data within the electronic MEDICAL RECORD NUMBER Nursing notes reviewed and incorporated, Labs reviewed , Old chart reviewed, Notes from prior ED visits, and [place] Controlled Substance Database  ____________________________________________   FINAL CLINICAL IMPRESSION(S) / ED DIAGNOSES  Final diagnoses:  Vaginal bleeding      NEW MEDICATIONS STARTED DURING THIS VISIT:  New Prescriptions   No medications on file     Note:  This document was prepared using Dragon voice recognition software and may include unintentional dictation errors.    Faythe Ghee, PA-C 03/05/21 1256    Shaune Pollack, MD 03/06/21 [phone]

## 2021-03-05 NOTE — ED Triage Notes (Signed)
Pt states she had a positive home pregnancy test a week ago and after having intercourse she began having vaginal bleeding with abd cramping.,

## 2021-03-14 ENCOUNTER — Encounter: Payer: Self-pay | Admitting: Obstetrics and Gynecology

## 2021-03-14 ENCOUNTER — Other Ambulatory Visit: Payer: Self-pay

## 2021-03-14 ENCOUNTER — Ambulatory Visit (INDEPENDENT_AMBULATORY_CARE_PROVIDER_SITE_OTHER): Payer: Medicaid Other | Admitting: Obstetrics and Gynecology

## 2021-03-14 VITALS — BP 125/82 | HR 62 | Ht 62.0 in | Wt 141.9 lb

## 2021-03-14 DIAGNOSIS — Z3009 Encounter for other general counseling and advice on contraception: Secondary | ICD-10-CM

## 2021-03-14 DIAGNOSIS — Z30430 Encounter for insertion of intrauterine contraceptive device: Secondary | ICD-10-CM | POA: Diagnosis not present

## 2021-03-14 LAB — POCT URINE PREGNANCY: Preg Test, Ur: NEGATIVE

## 2021-03-14 NOTE — Progress Notes (Signed)
HPI:      [name] is a 30 y.o. [id number] who LMP was Patient's last menstrual period was 03/05/2021 (exact date).  Subjective:   She presents today for IUD insertion.  She is currently using female condoms for birth control.  She does not want OCPs because she has difficulty remembering to take them.  She previously had an IUD and had a good experience with that except for issues with the strings.  She would consider this again.    Hx: The following portions of the patient's history were reviewed and updated as appropriate:             She  has a past medical history of Migraine and Spontaneous abortion (12/2013). She does not have any pertinent problems on file. She  has a past surgical history that includes Dilation and evacuation (N/A, 04/15/2020). Her family history includes Anemia in her father; Breast cancer in her paternal aunt and paternal aunt; Diabetes in her father, mother, and paternal grandmother; Leukemia in her father; Ovarian cancer in her paternal aunt and paternal aunt. She  reports that she quit smoking about 6 years ago. Her smoking use included cigarettes. She has never used smokeless tobacco. She reports current alcohol use. She reports that she does not currently use drugs after having used the following drugs: Cocaine. She has a current medication list which includes the following prescription(s): aspirin-acetaminophen-caffeine. She has No Known Allergies.       Review of Systems:  Review of Systems  Constitutional: Denied constitutional symptoms, night sweats, recent illness, fatigue, fever, insomnia and weight loss.  Eyes: Denied eye symptoms, eye pain, photophobia, vision change and visual disturbance.  Ears/Nose/Throat/Neck: Denied ear, nose, throat or neck symptoms, hearing loss, nasal discharge, sinus congestion and sore throat.  Cardiovascular: Denied cardiovascular symptoms, arrhythmia, chest pain/pressure, edema, exercise intolerance, orthopnea and  palpitations.  Respiratory: Denied pulmonary symptoms, asthma, pleuritic pain, productive sputum, cough, dyspnea and wheezing.  Gastrointestinal: Denied, gastro-esophageal reflux, melena, nausea and vomiting.  Genitourinary: Denied genitourinary symptoms including symptomatic vaginal discharge, pelvic relaxation issues, and urinary complaints.  Musculoskeletal: Denied musculoskeletal symptoms, stiffness, swelling, muscle weakness and myalgia.  Dermatologic: Denied dermatology symptoms, rash and scar.  Neurologic: Denied neurology symptoms, dizziness, headache, neck pain and syncope.  Psychiatric: Denied psychiatric symptoms, anxiety and depression.  Endocrine: Denied endocrine symptoms including hot flashes and night sweats.   Meds:   Current Outpatient Medications on File Prior to Visit  Medication Sig Dispense Refill   Aspirin-Acetaminophen-Caffeine (EXCEDRIN PO) Take by mouth. As needed     No current facility-administered medications on file prior to visit.    Objective:     Vitals:   03/14/21 1049  BP: 125/82  Pulse: 62    Physical examination   Pelvic:   Vulva: Normal appearance.  No lesions.  Vagina: No lesions or abnormalities noted.  Support: Normal pelvic support.  Urethra No masses tenderness or scarring.  Meatus Normal size without lesions or prolapse.  Cervix: Normal appearance.  No lesions.  Anus: Normal exam.  No lesions.  Perineum: Normal exam.  No lesions.        Bimanual   Uterus: Normal size.  Non-tender.  Mobile.  AV.  Adnexae: No masses.  Non-tender to palpation.  Cul-de-sac: Negative for abnormality.   IUD Procedure Pt has read the booklet and signed the appropriate forms regarding the Mirena IUD.  All of her questions have been answered.   The cervix was cleansed with betadine solution.  After sounding the uterus and noting the position, the IUD was placed in the usual manner without problem.  The string was cut to the appropriate length.  The  patient tolerated the procedure well.            NDC # = [id number]   Assessment:    [id number] Patient Active Problem List   Diagnosis Date Noted   Active labor at term 12/14/2014   Labor and delivery, indication for care 12/13/2014   Pregnancy 12/03/2014   Indication for care in labor and delivery, antepartum 12/03/2014     1. Birth control counseling   2. Encounter for insertion of mirena IUD     After long discussion regarding multiple methods of birth control patient has chosen to have an IUD.  She would like to have inserted today.  Plan:             F/U  Return in about 4 weeks (around 04/11/2021) for For IUD f/u. I spent 21 additional minutes involved in the care of this patient discussing birth control options and risk benefits of each one.  She had special interest in IUD and Depo.  In addition I spent time preparing to see the patient by obtaining and reviewing her medical history (including labs, imaging tests and prior procedures), documenting clinical information in the electronic health record (EHR), counseling and coordinating care plans, writing and sending prescriptions, ordering tests or procedures and in direct communicating with the patient and medical staff discussing pertinent items from her history and physical exam.  Elonda Husky, M.D. 03/14/2021 11:16 AM

## 2021-03-14 NOTE — Progress Notes (Signed)
Pt present for birth control consult. Lmp 03/05/2021 pt would like to start depo injections for birth control. UPT-neg.

## 2021-04-11 ENCOUNTER — Encounter: Payer: Self-pay | Admitting: Obstetrics and Gynecology

## 2021-04-11 ENCOUNTER — Other Ambulatory Visit: Payer: Self-pay

## 2021-04-11 ENCOUNTER — Ambulatory Visit (INDEPENDENT_AMBULATORY_CARE_PROVIDER_SITE_OTHER): Payer: Medicaid Other | Admitting: Obstetrics and Gynecology

## 2021-04-11 VITALS — BP 124/72 | HR 69 | Resp 16 | Ht 63.0 in | Wt 148.2 lb

## 2021-04-11 DIAGNOSIS — N921 Excessive and frequent menstruation with irregular cycle: Secondary | ICD-10-CM

## 2021-04-11 DIAGNOSIS — Z30431 Encounter for routine checking of intrauterine contraceptive device: Secondary | ICD-10-CM

## 2021-04-11 DIAGNOSIS — Z975 Presence of (intrauterine) contraceptive device: Secondary | ICD-10-CM | POA: Diagnosis not present

## 2021-04-11 NOTE — Progress Notes (Addendum)
HPI:      Katherine Fitzgerald is a 30 y.o. [phone] who LMP was No LMP recorded. (Menstrual status: IUD).  Subjective:   She presents today for follow-up of her IUD.  She reports that she still has some spotting but is otherwise well.  She does state that her partner feels the strings more often than he would like and she would like the strings cut very short.    Hx: The following portions of the patient's history were reviewed and updated as appropriate:             She  has a past medical history of Migraine and Spontaneous abortion (12/2013). She does not have any pertinent problems on file. She  has a past surgical history that includes Dilation and evacuation (N/A, 04/15/2020). Her family history includes Anemia in her father; Breast cancer in her paternal aunt and paternal aunt; Diabetes in her father, mother, and paternal grandmother; Leukemia in her father; Ovarian cancer in her paternal aunt and paternal aunt. She  reports that she quit smoking about 6 years ago. Her smoking use included cigarettes. She has never used smokeless tobacco. She reports current alcohol use. She reports that she does not currently use drugs after having used the following drugs: Cocaine. She has a current medication list which includes the following prescription(s): aspirin-acetaminophen-caffeine. She has No Known Allergies.       Review of Systems:  Review of Systems  Constitutional: Denied constitutional symptoms, night sweats, recent illness, fatigue, fever, insomnia and weight loss.  Eyes: Denied eye symptoms, eye pain, photophobia, vision change and visual disturbance.  Ears/Nose/Throat/Neck: Denied ear, nose, throat or neck symptoms, hearing loss, nasal discharge, sinus congestion and sore throat.  Cardiovascular: Denied cardiovascular symptoms, arrhythmia, chest pain/pressure, edema, exercise intolerance, orthopnea and palpitations.  Respiratory: Denied pulmonary symptoms, asthma, pleuritic pain,  productive sputum, cough, dyspnea and wheezing.  Gastrointestinal: Denied, gastro-esophageal reflux, melena, nausea and vomiting.  Genitourinary: Denied genitourinary symptoms including symptomatic vaginal discharge, pelvic relaxation issues, and urinary complaints.  Musculoskeletal: Denied musculoskeletal symptoms, stiffness, swelling, muscle weakness and myalgia.  Dermatologic: Denied dermatology symptoms, rash and scar.  Neurologic: Denied neurology symptoms, dizziness, headache, neck pain and syncope.  Psychiatric: Denied psychiatric symptoms, anxiety and depression.  Endocrine: Denied endocrine symptoms including hot flashes and night sweats.   Meds:   Current Outpatient Medications on File Prior to Visit  Medication Sig Dispense Refill   Aspirin-Acetaminophen-Caffeine (EXCEDRIN PO) Take by mouth. As needed     No current facility-administered medications on file prior to visit.      Objective:     Vitals:   04/11/21 0958  BP: 124/72  Pulse: 69  Resp: 16   Filed Weights   04/11/21 0958  Weight: 148 lb 3.2 oz (67.2 kg)              Physical examination   Pelvic:   Vulva: Normal appearance.  No lesions.  Vagina: No lesions or abnormalities noted.  Support: Normal pelvic support.  Urethra No masses tenderness or scarring.  Meatus Normal size without lesions or prolapse.  Cervix: Normal appearance.  No lesions. IUD strings noted at cervical os.  And shortened to inside the cervical os at patient request.  Anus: Normal exam.  No lesions.  Perineum: Normal exam.  No lesions.        Bimanual   Uterus: Normal size.  Non-tender.  Mobile.  AV.  Adnexae: No masses.  Non-tender to palpation.  Cul-de-sac: Negative  for abnormality.             Assessment:    [id number] Patient Active Problem List   Diagnosis Date Noted   Active labor at term 12/14/2014   Labor and delivery, indication for care 12/13/2014   Pregnancy 12/03/2014   Indication for care in labor and  delivery, antepartum 12/03/2014     1. Surveillance of previously prescribed intrauterine contraceptive device   2. Breakthrough bleeding associated with intrauterine device (IUD)        Plan:            1.  Strings shortened-expect no further issues  2.  My expectation is that her breakthrough bleeding will stop in the next 2 weeks.  If it does not she has been instructed to call and we will use a pack of OCPs for 1 month to give her a break from bleeding. Orders No orders of the defined types were placed in this encounter.   No orders of the defined types were placed in this encounter.     F/U  Return for Annual Physical. I spent 21 minutes involved in the care of this patient preparing to see the patient by obtaining and reviewing her medical history (including labs, imaging tests and prior procedures), documenting clinical information in the electronic health record (EHR), counseling and coordinating care plans, writing and sending prescriptions, ordering tests or procedures and in direct communicating with the patient and medical staff discussing pertinent items from her history and physical exam.  Elonda Husky, M.D. 04/11/2021 10:22 AM

## 2021-04-16 ENCOUNTER — Encounter: Payer: Self-pay | Admitting: Emergency Medicine

## 2021-04-16 ENCOUNTER — Emergency Department
Admission: EM | Admit: 2021-04-16 | Discharge: 2021-04-16 | Disposition: A | Discharge status: Home / Self Care | Payer: Medicaid Other | Attending: Emergency Medicine | Admitting: Emergency Medicine

## 2021-04-16 ENCOUNTER — Emergency Department: Payer: Medicaid Other

## 2021-04-16 DIAGNOSIS — S91332A Puncture wound without foreign body, left foot, initial encounter: Secondary | ICD-10-CM | POA: Diagnosis not present

## 2021-04-16 DIAGNOSIS — W450XXA Nail entering through skin, initial encounter: Secondary | ICD-10-CM | POA: Insufficient documentation

## 2021-04-16 DIAGNOSIS — T148XXA Other injury of unspecified body region, initial encounter: Secondary | ICD-10-CM

## 2021-04-16 DIAGNOSIS — S99922A Unspecified injury of left foot, initial encounter: Secondary | ICD-10-CM | POA: Diagnosis present

## 2021-04-16 DIAGNOSIS — Z87891 Personal history of nicotine dependence: Secondary | ICD-10-CM | POA: Insufficient documentation

## 2021-04-16 DIAGNOSIS — Z23 Encounter for immunization: Secondary | ICD-10-CM | POA: Diagnosis not present

## 2021-04-16 MED ORDER — TETANUS-DIPHTH-ACELL PERTUSSIS 5-2.5-18.5 LF-MCG/0.5 IM SUSY
0.5000 mL | PREFILLED_SYRINGE | Freq: Once | INTRAMUSCULAR | Status: AC
  Administered 2021-04-16: 0.5 mL via INTRAMUSCULAR
  Filled 2021-04-16: qty 0.5

## 2021-04-16 MED ORDER — HYDROCODONE-ACETAMINOPHEN 5-325 MG PO TABS: 1.0000 | ORAL_TABLET | Freq: Once | ORAL | Status: DC

## 2021-04-16 MED ORDER — CEPHALEXIN 500 MG PO CAPS: 500.0000 mg | ORAL_CAPSULE | Freq: Three times a day (TID) | ORAL | 0 refills | Status: AC

## 2021-04-16 NOTE — ED Triage Notes (Signed)
Pt repots she step on rusty 1 inch nail with her left foot today at approx 1200. Swelling and entry point noted to the bottom of left foot. Unknown last tetanus.

## 2021-04-16 NOTE — ED Provider Notes (Signed)
Emergency Medicine Provider Triage Evaluation Note  Katherine Fitzgerald , a 30 y.o. female  was evaluated in triage.  Pt complains of stepping on a nail of the left foot.  Patient is tetanus shot is unknown.  Patient has a puncture wound to the left foot.  No other injury or complaint.  Review of Systems  Positive: Stepped on a nail with the left foot.  This went through the shoe Negative: No active bleeding.  Physical Exam  BP (!) 125/93 (BP Location: Right Arm)   Pulse 83   Temp 98.7 F (37.1 C) (Oral)   Resp 18   SpO2 98%  Gen:   Awake, no distress   Resp:  Normal effort  MSK:   Puncture wound to the left foot.  Performed by resting through the patient's shoe.  Unable to bear weight at this time Other:    Medical Decision Making  Medically screening exam initiated at 7:19 PM.  Appropriate orders placed.  Katherine Fitzgerald was informed that the remainder of the evaluation will be completed by another provider, this initial triage assessment does not replace that evaluation, and the importance of remaining in the ED until their evaluation is complete.  Patient stepped on a nail that went through her shoe.  Patient will have x-ray, tetanus shot at this time.   Lanette Hampshire 04/16/21 1929    Minna Antis, MD 04/16/21 [phone]

## 2021-04-16 NOTE — ED Provider Notes (Signed)
[facility]-EMERGENCY DEPARTMENT  ____________________________________________  Time seen: Approximately 11:49 PM  I have reviewed the triage vital signs and the nursing notes.   HISTORY  Chief Complaint Foot Injury   Historian Patient     HPI Katherine Fitzgerald is a 30 y.o. female presents to the emergency department after patient stepped on a rusty nail earlier in the day.  Patient has had swelling and has had difficulty bearing weight.  Patient cannot recall her last tetanus shot.  No numbness or tingling along left foot.   Past Medical History:  Diagnosis Date   Migraine    Spontaneous abortion 12/2013     Immunizations up to date:  Yes.     Past Medical History:  Diagnosis Date   Migraine    Spontaneous abortion 12/2013    Patient Active Problem List   Diagnosis Date Noted   Active labor at term 12/14/2014   Labor and delivery, indication for care 12/13/2014   Pregnancy 12/03/2014   Indication for care in labor and delivery, antepartum 12/03/2014    Past Surgical History:  Procedure Laterality Date   DILATION AND EVACUATION N/A 04/15/2020   Procedure: DILATATION AND EVACUATION;  Surgeon: Linzie Collin, MD;  Location: [facility] ORS;  Service: Gynecology;  Laterality: N/A;    Prior to Admission medications   Medication Sig Start Date End Date Taking? Authorizing Provider  cephALEXin (KEFLEX) 500 MG capsule Take 1 capsule (500 mg total) by mouth 3 (three) times daily for 7 days. 04/16/21 04/23/21 Yes Pia Mau M, PA-C  Aspirin-Acetaminophen-Caffeine (EXCEDRIN PO) Take by mouth. As needed    [provider]    Allergies Patient has no known allergies.  Family History  Problem Relation Age of Onset   Diabetes Mother    Diabetes Father    Leukemia Father    Anemia Father    Breast cancer Paternal Aunt    Breast cancer Paternal Aunt    Ovarian cancer Paternal Aunt    Ovarian cancer Paternal Aunt    Diabetes Paternal Grandmother     Social  History Social History   Tobacco Use   Smoking status: Former    Types: Cigarettes    Quit date: 09/04/2014    Years since quitting: 6.6   Smokeless tobacco: Never  Vaping Use   Vaping Use: Every day   Substances: Flavoring  Substance Use Topics   Alcohol use: Yes   Drug use: Not Currently    Types: Cocaine     Review of Systems  Constitutional: No fever/chills Eyes:  No discharge ENT: No upper respiratory complaints. Respiratory: no cough. No SOB/ use of accessory muscles to breath Gastrointestinal:   No nausea, no vomiting.  No diarrhea.  No constipation. Musculoskeletal: Negative for musculoskeletal pain. Skin: Patient has puncture wound.    ____________________________________________   PHYSICAL EXAM:  VITAL SIGNS: ED Triage Vitals  Enc Vitals Group     BP 04/16/21 1915 (!) 125/93     Pulse Rate 04/16/21 1915 83     Resp 04/16/21 1915 18     Temp 04/16/21 1915 98.7 F (37.1 C)     Temp Source 04/16/21 1915 Oral     SpO2 04/16/21 1915 98 %     Weight --      Height --      Head Circumference --      Peak Flow --      Pain Score 04/16/21 2310 5     Pain Loc --  Pain Edu? --      Excl. in GC? --      Constitutional: Alert and oriented. Well appearing and in no acute distress. Eyes: Conjunctivae are normal. PERRL. EOMI. Head: Atraumatic. ENT:      Ears: TMs are pearly.      Nose: No congestion/rhinnorhea.      Mouth/Throat: Mucous membranes are moist.  Neck: No stridor.  No cervical spine tenderness to palpation. Cardiovascular: Normal rate, regular rhythm. Normal S1 and S2.  Good peripheral circulation. Respiratory: Normal respiratory effort without tachypnea or retractions. Lungs CTAB. Good air entry to the bases with no decreased or absent breath sounds Gastrointestinal: Bowel sounds x 4 quadrants. Soft and nontender to palpation. No guarding or rigidity. No distention. Musculoskeletal: Full range of motion to all extremities. No obvious  deformities noted Neurologic:  Normal for age. No gross focal neurologic deficits are appreciated.  Skin: Patient has single puncture wound along the left midfoot. Psychiatric: Mood and affect are normal for age. Speech and behavior are normal.   ____________________________________________   LABS (all labs ordered are listed, but only abnormal results are displayed)  Labs Reviewed - No data to display ____________________________________________  EKG   ____________________________________________  RADIOLOGY Geraldo Pitter, personally viewed and evaluated these images (plain radiographs) as part of my medical decision making, as well as reviewing the written report by the radiologist.  DG Foot Complete Left  Result Date: 04/16/2021 CLINICAL DATA:  Rusty nail injury to the bottom of LEFT foot. EXAM: LEFT FOOT - COMPLETE 3+ VIEW COMPARISON:  None FINDINGS: Soft tissue swelling about the plantar aspect of the foot just proximal to the forefoot. Small amount of soft tissue gas may be evident in this area perhaps a site of penetrating injury. No radiopaque foreign body. No sign of fracture or dislocation. IMPRESSION: Soft tissue swelling about the plantar aspect of the foot just proximal to the forefoot with possible small amount of soft tissue gas, may reflect site of injury. No radiopaque foreign body or fracture. Electronically Signed   By: Donzetta Kohut M.D.   On: 04/16/2021 19:41    ____________________________________________    PROCEDURES  Procedure(s) performed:     Procedures     Medications  Tdap (BOOSTRIX) injection 0.5 mL (0.5 mLs Intramuscular Given 04/16/21 2100)     ____________________________________________   INITIAL IMPRESSION / ASSESSMENT AND PLAN / ED COURSE  Pertinent labs & imaging results that were available during my care of the patient were reviewed by me and considered in my medical decision making (see chart for details).      Assessment  and plan Puncture wound 30 year old female presents to the emergency department with a puncture wound along the plantar aspect of the left foot.  There is no bony abnormality or retained foreign body on dedicated x-ray.  Patient was placed in a cam boot and crutches were provided.  She was discharged with Keflex and her tetanus status was updated prior to discharge.     ____________________________________________  FINAL CLINICAL IMPRESSION(S) / ED DIAGNOSES  Final diagnoses:  Puncture wound      NEW MEDICATIONS STARTED DURING THIS VISIT:  ED Discharge Orders          Ordered    cephALEXin (KEFLEX) 500 MG capsule  3 times daily        04/16/21 2242                This chart was dictated using voice recognition software/Dragon. Despite  best efforts to proofread, errors can occur which can change the meaning. Any change was purely unintentional.     Orvil Feil, PA-C 04/16/21 2351    Merwyn Katos, MD 04/21/21 2006

## 2021-04-16 NOTE — Discharge Instructions (Signed)
Take Keflex 3 times daily for the next seven days.  

## 2021-04-16 NOTE — ED Notes (Addendum)
Pt reporting numbness to the left great toe. Pedal pulses present. PA Jaclyn made aware and no further treatment or interventions at this time. Will offer to elevate foot.

## 2021-05-06 ENCOUNTER — Telehealth: Payer: Self-pay | Admitting: Obstetrics and Gynecology

## 2021-05-06 NOTE — Telephone Encounter (Signed)
Pt came in to office this morning wanting message sent to Dr.Evans requesting birth control to help with bleeding since having IUD placed. Confirmed pharmacy as [facility] in [place]. Please Advise.

## 2021-05-08 NOTE — Telephone Encounter (Signed)
Called patient. She will pick up sample at front desk.

## 2021-05-12 ENCOUNTER — Telehealth: Payer: Self-pay | Admitting: Obstetrics and Gynecology

## 2021-05-12 NOTE — Telephone Encounter (Signed)
Pt called- she is scheduled for next available apt

## 2021-05-12 NOTE — Telephone Encounter (Signed)
Called patient to schedule appointment to have IUD strings checked again. No answer lvm to rtc.

## 2021-05-12 NOTE — Telephone Encounter (Signed)
Pt called stating that Dr.Evans had cut her IUD strings in past do her fiance could not feel them, pt states that he can feel them again and she has been bleeding heavy. Please Advise.

## 2021-05-15 ENCOUNTER — Other Ambulatory Visit: Payer: Self-pay

## 2021-05-15 ENCOUNTER — Ambulatory Visit (INDEPENDENT_AMBULATORY_CARE_PROVIDER_SITE_OTHER): Payer: Medicaid Other | Admitting: Obstetrics and Gynecology

## 2021-05-15 ENCOUNTER — Encounter: Payer: Self-pay | Admitting: Obstetrics and Gynecology

## 2021-05-15 VITALS — BP 129/87 | HR 77 | Ht 63.0 in | Wt 148.3 lb

## 2021-05-15 DIAGNOSIS — Z975 Presence of (intrauterine) contraceptive device: Secondary | ICD-10-CM

## 2021-05-15 DIAGNOSIS — N921 Excessive and frequent menstruation with irregular cycle: Secondary | ICD-10-CM | POA: Diagnosis not present

## 2021-05-15 NOTE — Progress Notes (Signed)
HPI:      [name] is a 30 y.o. [phone] who LMP was No LMP recorded. (Menstrual status: IUD).  Subjective:   She presents today stating that she has been spotting every day since the IUD was inserted more than 2 months ago.  She has tried OCPs for 1 month and states that she continues to improve despite the pills. In addition her IUD strings have been shortened 1 time but her boyfriend continues to state that he can feel them with intercourse.    Hx: The following portions of the patient's history were reviewed and updated as appropriate:             She  has a past medical history of Migraine and Spontaneous abortion (12/2013). She does not have any pertinent problems on file. She  has a past surgical history that includes Dilation and evacuation (N/A, 04/15/2020). Her family history includes Anemia in her father; Breast cancer in her paternal aunt and paternal aunt; Diabetes in her father, mother, and paternal grandmother; Leukemia in her father; Ovarian cancer in her paternal aunt and paternal aunt. She  reports that she quit smoking about 6 years ago. Her smoking use included cigarettes. She has never used smokeless tobacco. She reports current alcohol use. She reports that she does not currently use drugs after having used the following drugs: Cocaine. She has a current medication list which includes the following prescription(s): aspirin-acetaminophen-caffeine and levonorgestrel. She has No Known Allergies.       Review of Systems:  Review of Systems  Constitutional: Denied constitutional symptoms, night sweats, recent illness, fatigue, fever, insomnia and weight loss.  Eyes: Denied eye symptoms, eye pain, photophobia, vision change and visual disturbance.  Ears/Nose/Throat/Neck: Denied ear, nose, throat or neck symptoms, hearing loss, nasal discharge, sinus congestion and sore throat.  Cardiovascular: Denied cardiovascular symptoms, arrhythmia, chest pain/pressure, edema,  exercise intolerance, orthopnea and palpitations.  Respiratory: Denied pulmonary symptoms, asthma, pleuritic pain, productive sputum, cough, dyspnea and wheezing.  Gastrointestinal: Denied, gastro-esophageal reflux, melena, nausea and vomiting.  Genitourinary: See HPI for additional information.  Musculoskeletal: Denied musculoskeletal symptoms, stiffness, swelling, muscle weakness and myalgia.  Dermatologic: Denied dermatology symptoms, rash and scar.  Neurologic: Denied neurology symptoms, dizziness, headache, neck pain and syncope.  Psychiatric: Denied psychiatric symptoms, anxiety and depression.  Endocrine: Denied endocrine symptoms including hot flashes and night sweats.   Meds:   Current Outpatient Medications on File Prior to Visit  Medication Sig Dispense Refill   Aspirin-Acetaminophen-Caffeine (EXCEDRIN PO) Take by mouth. As needed     levonorgestrel (MIRENA) 20 MCG/DAY IUD 1 each by Intrauterine route once.     No current facility-administered medications on file prior to visit.      Objective:     Vitals:   05/15/21 1423  BP: 129/87  Pulse: 77   Filed Weights   05/15/21 1423  Weight: 148 lb 4.8 oz (67.3 kg)              Physical examination   Pelvic:   Vulva: Normal appearance.  No lesions.  Vagina: No lesions or abnormalities noted.  Support: Normal pelvic support.  Urethra No masses tenderness or scarring.  Meatus Normal size without lesions or prolapse.  Cervix: Normal appearance.  No lesions. IUD strings noted at cervical os.  They appear approximately the same length as I cut them last time.  I have once again shorten them this time to up inside the cervical canal.  Anus: Normal exam.  No lesions.  Perineum: Normal exam.  No lesions.        Bimanual   Uterus: Normal size.  Non-tender.  Mobile.  AV.  Adnexae: No masses.  Non-tender to palpation.  Cul-de-sac: Negative for abnormality.             Assessment:    [id number] Patient Active Problem List    Diagnosis Date Noted   Active labor at term 12/14/2014   Labor and delivery, indication for care 12/13/2014   Pregnancy 12/03/2014   Indication for care in labor and delivery, antepartum 12/03/2014     1. Breakthrough bleeding associated with intrauterine device (IUD)     Possible malpositioned IUD based on history.   Plan:            1.  Ultrasound to examine the location of the IUD. Orders Orders Placed This Encounter  Procedures   US PELVIS (TRANSABDOMINAL ONLY)   US PELVIS TRANSVAGINAL NON-OB (TV ONLY)    No orders of the defined types were placed in this encounter.     F/U  No follow-ups on file. I spent 22 minutes involved in the care of this patient preparing to see the patient by obtaining and reviewing her medical history (including labs, imaging tests and prior procedures), documenting clinical information in the electronic health record (EHR), counseling and coordinating care plans, writing and sending prescriptions, ordering tests or procedures and in direct communicating with the patient and medical staff discussing pertinent items from her history and physical exam.  Elonda Husky, M.D. 05/15/2021 2:47 PM

## 2021-06-02 ENCOUNTER — Other Ambulatory Visit: Payer: Self-pay

## 2021-06-02 ENCOUNTER — Ambulatory Visit (INDEPENDENT_AMBULATORY_CARE_PROVIDER_SITE_OTHER): Payer: Medicaid Other

## 2021-06-02 DIAGNOSIS — N921 Excessive and frequent menstruation with irregular cycle: Secondary | ICD-10-CM | POA: Diagnosis not present

## 2021-06-02 DIAGNOSIS — Z975 Presence of (intrauterine) contraceptive device: Secondary | ICD-10-CM

## 2021-06-03 ENCOUNTER — Telehealth: Payer: Self-pay | Admitting: Obstetrics and Gynecology

## 2021-06-03 NOTE — Telephone Encounter (Signed)
Pt called asking if her [place] from yesterday had been reviewed yet- made pt aware that all providers have been in active clinic all day and I will pass call along.

## 2021-06-04 NOTE — Telephone Encounter (Signed)
Pt is calling in to check the status of the [place] and she is aware that someone in the office will call her to give her the results.

## 2021-06-08 NOTE — Progress Notes (Signed)
Based on this Ultrasound, the lUD is correctly positioned and there is no explanation noted for your irreg bleeding. I hope that it continues to resolve for you. If not please schedule a visit or a video visit to discuss this further.

## 2021-06-09 ENCOUNTER — Telehealth: Payer: Self-pay | Admitting: Obstetrics and Gynecology

## 2021-06-09 NOTE — Telephone Encounter (Signed)
Completed pt scheduled 12-01 Video Visit

## 2021-06-09 NOTE — Telephone Encounter (Signed)
Pt called she states that she saw on mychart from [place] that IUD was in place, she is concerned about amount of bleeding she is having, states that she is going through 3 panty liners in one hour, bleeding since IUD placed in September. Also pt is experiencing pain during intercourse. Please Advise.

## 2021-06-09 NOTE — Progress Notes (Signed)
Patient called.  Left message for patient to call back, also sent patient my chart message.

## 2021-06-16 NOTE — Telephone Encounter (Signed)
She has an appointment on 12/1.

## 2021-06-19 ENCOUNTER — Encounter: Payer: Self-pay | Admitting: Obstetrics and Gynecology

## 2021-06-19 ENCOUNTER — Telehealth (INDEPENDENT_AMBULATORY_CARE_PROVIDER_SITE_OTHER): Payer: Medicaid Other | Admitting: Obstetrics and Gynecology

## 2021-06-19 DIAGNOSIS — N921 Excessive and frequent menstruation with irregular cycle: Secondary | ICD-10-CM | POA: Diagnosis not present

## 2021-06-19 DIAGNOSIS — N83299 Other ovarian cyst, unspecified side: Secondary | ICD-10-CM | POA: Diagnosis not present

## 2021-06-19 DIAGNOSIS — R102 Pelvic and perineal pain: Secondary | ICD-10-CM | POA: Diagnosis not present

## 2021-06-19 DIAGNOSIS — Z975 Presence of (intrauterine) contraceptive device: Secondary | ICD-10-CM

## 2021-06-19 NOTE — Progress Notes (Signed)
Virtual Visit via Video Note  I connected with Katherine Fitzgerald on 06/19/21 at  7:30 AM EST by video and verified that I was speaking with the correct person using two identifiers.    Katherine Fitzgerald is a 30 y.o. [phone] who LMP was No LMP recorded. (Menstrual status: IUD). I discussed the limitations, risks, security and privacy concerns of performing an evaluation and management service by video and the availability of in person appointments. I also discussed with the patient that there may be a patient responsible charge related to this service. The patient expressed understanding and agreed to proceed.  Location of [place]:  [place]  Patient gave explicit verbal consent for video visit:  YES  Location of provider:  [facility] office  Persons other than physician and patient involved in provider conference:  None   Subjective:   History of Present Illness:    She has been having right-sided pelvic pain and pain with intercourse as well as daily spotting with her IUD.  She has had spotting almost every day since insertion 4 months ago.  She is understandably upset about this continued bleeding.  She was tried on OCPs and her bleeding did not abate. She recently had an ultrasound showing a complex ovarian cyst consistent with a hemorrhagic cyst.  Her IUD was correctly positioned.  Hx: The following portions of the patient's history were reviewed and updated as appropriate:             She  has a past medical history of Migraine and Spontaneous abortion (12/2013). She does not have any pertinent problems on file. She  has a past surgical history that includes Dilation and evacuation (N/A, 04/15/2020). Her family history includes Anemia in her father; Breast cancer in her paternal aunt and paternal aunt; Diabetes in her father, mother, and paternal grandmother; Leukemia in her father; Ovarian cancer in her paternal aunt and paternal aunt. She  reports that she quit smoking about 6 years ago.  Her smoking use included cigarettes. She has never used smokeless tobacco. She reports current alcohol use. She reports that she does not currently use drugs after having used the following drugs: Cocaine. She has a current medication list which includes the following prescription(s): aspirin-acetaminophen-caffeine and levonorgestrel. She has No Known Allergies.       Review of Systems:  Review of Systems  Constitutional: Denied constitutional symptoms, night sweats, recent illness, fatigue, fever, insomnia and weight loss.  Eyes: Denied eye symptoms, eye pain, photophobia, vision change and visual disturbance.  Ears/Nose/Throat/Neck: Denied ear, nose, throat or neck symptoms, hearing loss, nasal discharge, sinus congestion and sore throat.  Cardiovascular: Denied cardiovascular symptoms, arrhythmia, chest pain/pressure, edema, exercise intolerance, orthopnea and palpitations.  Respiratory: Denied pulmonary symptoms, asthma, pleuritic pain, productive sputum, cough, dyspnea and wheezing.  Gastrointestinal: Denied, gastro-esophageal reflux, melena, nausea and vomiting.  Genitourinary: See HPI for additional information.  Musculoskeletal: Denied musculoskeletal symptoms, stiffness, swelling, muscle weakness and myalgia.  Dermatologic: Denied dermatology symptoms, rash and scar.  Neurologic: Denied neurology symptoms, dizziness, headache, neck pain and syncope.  Psychiatric: Denied psychiatric symptoms, anxiety and depression.  Endocrine: Denied endocrine symptoms including hot flashes and night sweats.   Meds:   Current Outpatient Medications on File Prior to Visit  Medication Sig Dispense Refill   Aspirin-Acetaminophen-Caffeine (EXCEDRIN PO) Take by mouth. As needed     levonorgestrel (MIRENA) 20 MCG/DAY IUD 1 each by Intrauterine route once.     No current facility-administered medications on file prior to  visit.    Assessment:    [id number] Patient Active Problem List   Diagnosis Date  Noted   Active labor at term 12/14/2014   Labor and delivery, indication for care 12/13/2014   Pregnancy 12/03/2014   Indication for care in labor and delivery, antepartum 12/03/2014     1. Breakthrough bleeding associated with intrauterine device (IUD)   2. Complex ovarian cyst   3. Pelvic pain in female     Her pain is manageable.  It is likely secondary to her ovarian cyst.  Her IUD is positioned correctly-I have no good explanation for her daily bleeding.  Plan:            1.  We have discussed multiple options and the patient has chosen to have her IUD removed.  She will make an appointment for removal.  2.  We then discussed multiple birth control methods risks and benefits.  She has not yet decided and will inform me at her next office visit.  We specifically discussed OCPs, Nexplanon, Depo-Provera, and NuvaRing.  3.  We have also discussed her ovarian cyst and I recommended a follow-up ultrasound in 2 to 3 months to document resolution.  Orders No orders of the defined types were placed in this encounter.   No orders of the defined types were placed in this encounter.     F/U  No follow-ups on file. I spent 21 minutes involved in the care of this patient preparing to see the patient by obtaining and reviewing her medical history (including labs, imaging tests and prior procedures), documenting clinical information in the electronic health record (EHR), counseling and coordinating care plans, writing and sending prescriptions, ordering tests or procedures and in direct communicating with the patient and medical staff discussing pertinent items from her history and physical exam.   Elonda Husky, M.D. 06/19/2021 7:48 AM

## 2021-06-20 ENCOUNTER — Telehealth: Payer: Self-pay | Admitting: Obstetrics and Gynecology

## 2021-06-20 NOTE — Telephone Encounter (Signed)
Pt called stating that she has been bleeding for 4 months now- she is scheduled for iud removal on 12-15- pt states that she has started hiving strong blood smell and is wanting to know if this normal. Please Advise.

## 2021-07-03 ENCOUNTER — Encounter: Payer: Self-pay | Admitting: Obstetrics and Gynecology

## 2021-07-03 ENCOUNTER — Ambulatory Visit (INDEPENDENT_AMBULATORY_CARE_PROVIDER_SITE_OTHER): Payer: Medicaid Other | Admitting: Obstetrics and Gynecology

## 2021-07-03 ENCOUNTER — Other Ambulatory Visit: Payer: Self-pay

## 2021-07-03 VITALS — BP 117/81 | HR 59 | Resp 16 | Ht 63.0 in | Wt 149.2 lb

## 2021-07-03 DIAGNOSIS — N921 Excessive and frequent menstruation with irregular cycle: Secondary | ICD-10-CM | POA: Diagnosis not present

## 2021-07-03 DIAGNOSIS — N83299 Other ovarian cyst, unspecified side: Secondary | ICD-10-CM

## 2021-07-03 DIAGNOSIS — Z3009 Encounter for other general counseling and advice on contraception: Secondary | ICD-10-CM | POA: Diagnosis not present

## 2021-07-03 DIAGNOSIS — Z975 Presence of (intrauterine) contraceptive device: Secondary | ICD-10-CM | POA: Diagnosis not present

## 2021-07-03 NOTE — Progress Notes (Signed)
HPI:      [name] is a 30 y.o. [phone] who LMP was No LMP recorded (lmp unknown). (Menstrual status: IUD).  Subjective:   She presents today stating that she is generally feeling better and has not had bleeding over the last 2 days.  She also states that her bleeding last weekend was much less than usual.  She has changed her mind regarding removal of her IUD at this time.  She is hoping that her bleeding has now stopped for good. She does complain of occasional right-sided pelvic pain with intercourse. She also has a right-sided pain that is different that occurs at the end of working a long day in [employer].    Hx: The following portions of the patient's history were reviewed and updated as appropriate:             She  has a past medical history of Migraine and Spontaneous abortion (12/2013). She does not have any pertinent problems on file. She  has a past surgical history that includes Dilation and evacuation (N/A, 04/15/2020). Her family history includes Anemia in her father; Breast cancer in her paternal aunt and paternal aunt; Diabetes in her father, mother, and paternal grandmother; Leukemia in her father; Ovarian cancer in her paternal aunt and paternal aunt. She  reports that she quit smoking about 6 years ago. Her smoking use included cigarettes. She has never used smokeless tobacco. She reports current alcohol use. She reports that she does not currently use drugs after having used the following drugs: Cocaine. She has a current medication list which includes the following prescription(s): vitamin d3. She has No Known Allergies.       Review of Systems:  Review of Systems  Constitutional: Denied constitutional symptoms, night sweats, recent illness, fatigue, fever, insomnia and weight loss.  Eyes: Denied eye symptoms, eye pain, photophobia, vision change and visual disturbance.  Ears/Nose/Throat/Neck: Denied ear, nose, throat or neck symptoms, hearing loss, nasal  discharge, sinus congestion and sore throat.  Cardiovascular: Denied cardiovascular symptoms, arrhythmia, chest pain/pressure, edema, exercise intolerance, orthopnea and palpitations.  Respiratory: Denied pulmonary symptoms, asthma, pleuritic pain, productive sputum, cough, dyspnea and wheezing.  Gastrointestinal: Denied, gastro-esophageal reflux, melena, nausea and vomiting.  Genitourinary: See HPI for additional information.  Musculoskeletal: Denied musculoskeletal symptoms, stiffness, swelling, muscle weakness and myalgia.  Dermatologic: Denied dermatology symptoms, rash and scar.  Neurologic: Denied neurology symptoms, dizziness, headache, neck pain and syncope.  Psychiatric: Denied psychiatric symptoms, anxiety and depression.  Endocrine: Denied endocrine symptoms including hot flashes and night sweats.   Meds:   Current Outpatient Medications on File Prior to Visit  Medication Sig Dispense Refill   Cholecalciferol (VITAMIN D3) 1.25 MG (50000 UT) CAPS Take 1 capsule by mouth once a week.     No current facility-administered medications on file prior to visit.      Objective:     Vitals:   07/03/21 1425  BP: 117/81  Pulse: (!) 59  Resp: 16   Filed Weights   07/03/21 1425  Weight: 149 lb 3.2 oz (67.7 kg)              Abdominal examination reveals no sign of hernia.          Assessment:    [id number] Patient Active Problem List   Diagnosis Date Noted   Active labor at term 12/14/2014   Labor and delivery, indication for care 12/13/2014   Pregnancy 12/03/2014   Indication for care in labor and delivery,  antepartum 12/03/2014     1. Complex ovarian cyst   2. Breakthrough bleeding associated with intrauterine device (IUD)   3. Birth control counseling     Patient slightly doing better with her bleeding.  Does not want IUD removal.  Complex ovarian cyst likely causing pain with intercourse.  It is not disabling.   Plan:            1.  Continue expectant  management of ovarian cyst-expect resolution.  Follow-up ultrasound ordered.  2.  Expectant management of IUD.  If patient experiences continued bleeding she will again asked to have it removed. Orders No orders of the defined types were placed in this encounter.   No orders of the defined types were placed in this encounter.     F/U  Return for Pt to contact us if symptoms worsen. I spent 13 minutes involved in the care of this patient preparing to see the patient by obtaining and reviewing her medical history (including labs, imaging tests and prior procedures), documenting clinical information in the electronic health record (EHR), counseling and coordinating care plans, writing and sending prescriptions, ordering tests or procedures and in direct communicating with the patient and medical staff discussing pertinent items from her history and physical exam.  Elonda Husky, M.D. 07/03/2021 2:54 PM

## 2021-07-29 ENCOUNTER — Other Ambulatory Visit: Payer: Self-pay

## 2021-07-29 ENCOUNTER — Ambulatory Visit (INDEPENDENT_AMBULATORY_CARE_PROVIDER_SITE_OTHER): Payer: Medicaid Other

## 2021-07-29 DIAGNOSIS — N83299 Other ovarian cyst, unspecified side: Secondary | ICD-10-CM | POA: Diagnosis not present

## 2021-08-01 ENCOUNTER — Telehealth: Payer: Self-pay | Admitting: Obstetrics and Gynecology

## 2021-08-06 ENCOUNTER — Ambulatory Visit (INDEPENDENT_AMBULATORY_CARE_PROVIDER_SITE_OTHER): Payer: Medicaid Other | Admitting: Obstetrics and Gynecology

## 2021-08-06 ENCOUNTER — Other Ambulatory Visit: Payer: Self-pay

## 2021-08-06 ENCOUNTER — Encounter: Payer: Self-pay | Admitting: Obstetrics and Gynecology

## 2021-08-06 VITALS — BP 108/70 | HR 92 | Ht 63.0 in | Wt 131.9 lb

## 2021-08-06 DIAGNOSIS — Z30432 Encounter for removal of intrauterine contraceptive device: Secondary | ICD-10-CM

## 2021-08-06 DIAGNOSIS — N644 Mastodynia: Secondary | ICD-10-CM

## 2021-08-06 DIAGNOSIS — N921 Excessive and frequent menstruation with irregular cycle: Secondary | ICD-10-CM

## 2021-08-06 DIAGNOSIS — Z975 Presence of (intrauterine) contraceptive device: Secondary | ICD-10-CM

## 2021-08-06 NOTE — Progress Notes (Signed)
HPI:      Katherine Fitzgerald is a 31 y.o. [phone] who LMP was No LMP recorded. (Menstrual status: IUD).  Subjective:   She presents today stating that she has been spotting again with her IUD.  She is tired of bleeding with her IUD and would like it removed.  She recently had an ultrasound showing that her ovarian cyst has resolved and the IUD remains positioned correctly within the uterus. She also complains of new onset of left breast pain which has been present for approximately 2 weeks.  She states that she does drink caffeine daily but not large amounts.    Hx: The following portions of the patient's history were reviewed and updated as appropriate:             She  has a past medical history of Migraine and Spontaneous abortion (12/2013). She does not have any pertinent problems on file. She  has a past surgical history that includes Dilation and evacuation (N/A, 04/15/2020). Her family history includes Anemia in her father; Breast cancer in her paternal aunt and paternal aunt; Diabetes in her father, mother, and paternal grandmother; Leukemia in her father; Ovarian cancer in her paternal aunt and paternal aunt. She  reports that she quit smoking about 6 years ago. Her smoking use included cigarettes. She has never used smokeless tobacco. She reports current alcohol use. She reports that she does not currently use drugs after having used the following drugs: Cocaine. She has a current medication list which includes the following prescription(s): vitamin d3. She has No Known Allergies.       Review of Systems:  Review of Systems  Constitutional: Denied constitutional symptoms, night sweats, recent illness, fatigue, fever, insomnia and weight loss.  Eyes: Denied eye symptoms, eye pain, photophobia, vision change and visual disturbance.  Ears/Nose/Throat/Neck: Denied ear, nose, throat or neck symptoms, hearing loss, nasal discharge, sinus congestion and sore throat.  Cardiovascular:  Denied cardiovascular symptoms, arrhythmia, chest pain/pressure, edema, exercise intolerance, orthopnea and palpitations.  Respiratory: Denied pulmonary symptoms, asthma, pleuritic pain, productive sputum, cough, dyspnea and wheezing.  Gastrointestinal: Denied, gastro-esophageal reflux, melena, nausea and vomiting.  Genitourinary: See HPI for additional information.  Musculoskeletal: Denied musculoskeletal symptoms, stiffness, swelling, muscle weakness and myalgia.  Dermatologic: Denied dermatology symptoms, rash and scar.  Neurologic: Denied neurology symptoms, dizziness, headache, neck pain and syncope.  Psychiatric: Denied psychiatric symptoms, anxiety and depression.  Endocrine: Denied endocrine symptoms including hot flashes and night sweats.   Meds:   Current Outpatient Medications on File Prior to Visit  Medication Sig Dispense Refill   Cholecalciferol (VITAMIN D3) 1.25 MG (50000 UT) CAPS Take 1 capsule by mouth once a week.     No current facility-administered medications on file prior to visit.      Objective:     Vitals:   08/06/21 0853  BP: 108/70  Pulse: 92   Filed Weights   08/06/21 0853  Weight: 131 lb 14.4 oz (59.8 kg)              Breast examination:  Left breast-patient complains of tenderness at approximately 2 o'clock position.  No masses palpated.  No nipple discharge.  No axillary adenopathy.  IUD Removal Strings of IUD identified and grasped.  IUD removed without problem.  Pt tolerated this well.  IUD noted to be intact.          Assessment:    [id number] Patient Active Problem List   Diagnosis Date Noted   Active  labor at term 12/14/2014   Labor and delivery, indication for care 12/13/2014   Pregnancy 12/03/2014   Indication for care in labor and delivery, antepartum 12/03/2014     1. Breakthrough bleeding with IUD   2. Breast pain   3. Encounter for IUD removal        Plan:            1.  Patient has not yet decided birth control  after IUD removal.  She will inform [place] when she desires birth control.  2.  Expectant management of left breast pain.  Advised patient to have 1 menstrual period and reexamine.  She will then inform us.  If pain is still present consider imaging. Orders No orders of the defined types were placed in this encounter.   No orders of the defined types were placed in this encounter.     F/U  No follow-ups on file. I spent 21 minutes involved in the care of this patient preparing to see the patient by obtaining and reviewing her medical history (including labs, imaging tests and prior procedures), documenting clinical information in the electronic health record (EHR), counseling and coordinating care plans, writing and sending prescriptions, ordering tests or procedures and in direct communicating with the patient and medical staff discussing pertinent items from her history and physical exam.  Finis Bud, M.D. 08/06/2021 9:19 AM

## 2021-08-08 ENCOUNTER — Telehealth: Payer: Self-pay | Admitting: Obstetrics and Gynecology

## 2021-08-08 NOTE — Telephone Encounter (Signed)
Called patient she is concerned about the bleeding she is having. She bleed continuously when IUD was placed, had it removed and she is having a heavier flow like period flow. Please advise.

## 2021-08-08 NOTE — Telephone Encounter (Signed)
Pt states she had iud removed on wed- she has been having bleeding (period like- heavy flow) since, she was told expect spotting, she wanted to know if this is normal. Please Advise.

## 2021-08-12 NOTE — Telephone Encounter (Signed)
Patient called.  Patient aware.  

## 2021-08-18 NOTE — Telephone Encounter (Signed)
Made in error

## 2021-10-02 ENCOUNTER — Emergency Department
Admission: EM | Admit: 2021-10-02 | Discharge: 2021-10-02 | Disposition: A | Discharge status: Home / Self Care | Payer: Medicaid Other | Attending: Student in an Organized Health Care Education/Training Program | Admitting: Student in an Organized Health Care Education/Training Program

## 2021-10-02 ENCOUNTER — Other Ambulatory Visit: Payer: Self-pay

## 2021-10-02 DIAGNOSIS — J029 Acute pharyngitis, unspecified: Secondary | ICD-10-CM | POA: Diagnosis present

## 2021-10-02 DIAGNOSIS — Z20822 Contact with and (suspected) exposure to covid-19: Secondary | ICD-10-CM | POA: Insufficient documentation

## 2021-10-02 DIAGNOSIS — J040 Acute laryngitis: Secondary | ICD-10-CM | POA: Diagnosis not present

## 2021-10-02 DIAGNOSIS — J069 Acute upper respiratory infection, unspecified: Secondary | ICD-10-CM | POA: Diagnosis not present

## 2021-10-02 LAB — GROUP A STREP BY PCR: Group A Strep by PCR: NOT DETECTED

## 2021-10-02 LAB — RESP PANEL BY RT-PCR (FLU A&B, COVID) ARPGX2
Influenza A by PCR: NEGATIVE
Influenza B by PCR: NEGATIVE
SARS Coronavirus 2 by RT PCR: NEGATIVE

## 2021-10-02 MED ORDER — DEXAMETHASONE 4 MG PO TABS
10.0000 mg | ORAL_TABLET | Freq: Once | ORAL | Status: AC
  Administered 2021-10-02: 10 mg via ORAL
  Filled 2021-10-02: qty 3

## 2021-10-02 MED ORDER — ALBUTEROL SULFATE HFA 108 (90 BASE) MCG/ACT IN AERS: 2.0000 | INHALATION_SPRAY | Freq: Four times a day (QID) | RESPIRATORY_TRACT | 2 refills | Status: AC | PRN

## 2021-10-02 MED ORDER — DEXAMETHASONE 1 MG/ML PO CONC
10.0000 mg | Freq: Once | ORAL | Status: DC
  Filled 2021-10-02: qty 10

## 2021-10-02 MED ORDER — BENZONATATE 100 MG PO CAPS: 100.0000 mg | ORAL_CAPSULE | Freq: Three times a day (TID) | ORAL | 0 refills | Status: AC | PRN

## 2021-10-02 NOTE — ED Provider Notes (Signed)
? ?[facility] ?Provider Note ? ? ? Event Date/Time  ? First MD Initiated Contact with Patient 10/02/21 [phone]   ?  (approximate) ? ? ?History  ? ?Chief Complaint ?URI ? ? ?HPI ?Katherine Fitzgerald is a 31 y.o. female, history of migraines, presents to the emergency department for evaluation of URI symptoms.  Patient states that she has been experiencing cough, sinus congestion, sore throat and body aches for the past 4 days.  She states that she has recently lost her voice.  Denies any recent sick exposures.  Denies fever/chills, chest pain, shortness of breath, back pain, urinary symptoms, ear pain, eye discharge, abdominal pain, nausea/vomiting, or diarrhea. ? ?History Limitations: No limitations. ? ?  ? ? ?Physical Exam  ?Triage Vital Signs: ?ED Triage Vitals [10/02/21 0742]  ?Enc Vitals Group  ?   BP   ?   Pulse   ?   Resp   ?   Temp   ?   Temp src   ?   SpO2   ?   Weight 160 lb (72.6 kg)  ?   Height 5\' 3"  (1.6 m)  ?   Head Circumference   ?   Peak Flow   ?   Pain Score 0  ?   Pain Loc   ?   Pain Edu?   ?   Excl. in GC?   ? ? ?Most recent vital signs: ?Vitals:  ? 10/02/21 0751  ?BP: (!) 139/118  ?Pulse: 75  ?Resp: 20  ?Temp: 98.2 ?F (36.8 ?C)  ?SpO2: 100%  ? ? ?General: Awake, NAD.  Audible cough in the room.  Patient notably has decreased voice. ?CV: Good peripheral perfusion.  ?Resp: Normal effort.  Lung sounds clear bilaterally in the apices and bases. ?Abd: Soft, non-tender. No distention.  ?Neuro: At baseline. No gross neurological deficits.  ?Other: Bilateral tonsillar swelling noted.  Exudates present.  Submandibular lymphadenopathy present on the left side.  Uvula midline.  Ear exam unremarkable bilaterally.  ? ?Physical Exam ? ? ? ?ED Results / Procedures / Treatments  ?Labs ?(all labs ordered are listed, but only abnormal results are displayed) ?Labs Reviewed  ?GROUP A STREP BY PCR  ?RESP PANEL BY RT-PCR (FLU A&B, COVID) [id number]  ? ? ? ?EKG ?Not applicable. ? ? ?RADIOLOGY ? ?ED  Provider Interpretation: Not applicable. ? ?No results found. ? ?PROCEDURES: ? ?Critical Care performed: Not applicable. ? ?Procedures ? ? ? ?MEDICATIONS ORDERED IN ED: ?Medications  ?dexamethasone (DECADRON) tablet 10 mg (10 mg Oral Given 10/02/21 0803)  ? ? ? ?IMPRESSION / MDM / ASSESSMENT AND PLAN / ED COURSE  ?I reviewed the triage vital signs and the nursing notes. ?             ?               ? ?Differential diagnosis includes, but is not limited to, COVID-19, influenza, strep pharyngitis, tonsillitis, viral URI ? ?ED Course ?Patient appears stable, but uncomfortable.  Vital signs within normal limits.  We will go ahead and treat with oral dexamethasone. ? ?Group A strep PCR negative.  Respiratory panel negative for COVID-19 or influenza ? ?Assessment/Plan ?Presentation consistent with laryngitis, likely secondary to viral etiology.  Work-up negative for COVID-19, influenza, or strep pharyngitis.  No evidence of significant lower respiratory involvement, though patient does endorse mild tightness in her upper chest when she coughs.  Very low suspicion for pneumonia given her stable presentation and lack of  fever.  We treated her here with oral dexamethasone.  We will plan to discharge her with a prescription for benzonatate and albuterol. ? ?Patient was provided with anticipatory guidance, return precautions, and educational material. Encouraged the patient to return to the emergency department at any time if they begin to experience any new or worsening symptoms.  ? ?  ? ? ?FINAL CLINICAL IMPRESSION(S) / ED DIAGNOSES  ? ?Final diagnoses:  ?Laryngitis  ?Viral URI with cough  ? ? ? ?Rx / DC Orders  ? ?ED Discharge Orders   ? ?      Ordered  ?  benzonatate (TESSALON PERLES) 100 MG capsule  3 times daily PRN       ? 10/02/21 0859  ?  albuterol (VENTOLIN HFA) 108 (90 Base) MCG/ACT inhaler  Every 6 hours PRN       ? 10/02/21 0859  ? ?  ?  ? ?  ? ? ? ?Note:  This document was prepared using Dragon voice recognition  software and may include unintentional dictation errors. ?  ?Teodoro Spray, [place] ?10/02/21 0902 ? ?  ?Merlyn Lot, MD ?10/02/21 1003 ? ?

## 2021-10-02 NOTE — ED Triage Notes (Signed)
Pt c/o cough with congestion since Sunday, denies fever. ?

## 2021-10-02 NOTE — ED Notes (Signed)
See triage note  presents with cough and congestion   states also had fever since Sunday ? ?

## 2021-10-02 NOTE — Discharge Instructions (Addendum)
-  Treat body aches/fever with Tylenol/ibuprofen as needed ?-Take benzonatate as needed for cough. ?-For chest tightness/cough, you may take albuterol as needed. ?-Return to the emergency department at any time if you begin to experience any new or worsening symptoms ?

## 2021-10-30 ENCOUNTER — Encounter: Payer: Medicaid Other | Admitting: Obstetrics and Gynecology

## 2021-11-05 ENCOUNTER — Ambulatory Visit (INDEPENDENT_AMBULATORY_CARE_PROVIDER_SITE_OTHER): Payer: Medicaid Other | Admitting: Obstetrics and Gynecology

## 2021-11-05 ENCOUNTER — Encounter: Payer: Self-pay | Admitting: Obstetrics and Gynecology

## 2021-11-05 VITALS — BP 111/60 | HR 67 | Ht 63.0 in | Wt 145.0 lb

## 2021-11-05 DIAGNOSIS — Z3200 Encounter for pregnancy test, result unknown: Secondary | ICD-10-CM | POA: Diagnosis not present

## 2021-11-05 DIAGNOSIS — N912 Amenorrhea, unspecified: Secondary | ICD-10-CM | POA: Diagnosis not present

## 2021-11-05 LAB — POCT URINE PREGNANCY: Preg Test, Ur: NEGATIVE

## 2021-11-05 NOTE — Progress Notes (Signed)
HPI: ?     [name] is a 31 y.o. [phone] who LMP was Patient's last menstrual period was 09/19/2021. ? ?Subjective:  ? ?She presents today stating that she has missed a menstrual period and she has had a positive home pregnancy test.  She was not attempting pregnancy but not preventing it either. ?She does not think she currently wants to be pregnant.  She had her IUD removed a few months ago. ? ?  Hx: ?The following portions of the patient's history were reviewed and updated as appropriate: ?            She  has a past medical history of Migraine and Spontaneous abortion (12/2013). ?She does not have any pertinent problems on file. ?She  has a past surgical history that includes Dilation and evacuation (N/A, 04/15/2020). ?Her family history includes Anemia in her father; Breast cancer in her paternal aunt and paternal aunt; Diabetes in her father, mother, and paternal grandmother; Leukemia in her father; Ovarian cancer in her paternal aunt and paternal aunt. ?She  reports that she quit smoking about 7 weeks ago. Her smoking use included cigarettes. She has never used smokeless tobacco. She reports that she does not currently use alcohol. She reports that she does not currently use drugs after having used the following drugs: Cocaine. ?She has a current medication list which includes the following prescription(s): albuterol and multivitamin-prenatal. ?She has No Known Allergies. ?      ?Review of Systems:  ?Review of Systems ? ?Constitutional: Denied constitutional symptoms, night sweats, recent illness, fatigue, fever, insomnia and weight loss.  ?Eyes: Denied eye symptoms, eye pain, photophobia, vision change and visual disturbance.  ?Ears/Nose/Throat/Neck: Denied ear, nose, throat or neck symptoms, hearing loss, nasal discharge, sinus congestion and sore throat.  ?Cardiovascular: Denied cardiovascular symptoms, arrhythmia, chest pain/pressure, edema, exercise intolerance, orthopnea and palpitations.   ?Respiratory: Denied pulmonary symptoms, asthma, pleuritic pain, productive sputum, cough, dyspnea and wheezing.  ?Gastrointestinal: Denied, gastro-esophageal reflux, melena, nausea and vomiting.  ?Genitourinary: Denied genitourinary symptoms including symptomatic vaginal discharge, pelvic relaxation issues, and urinary complaints.  ?Musculoskeletal: Denied musculoskeletal symptoms, stiffness, swelling, muscle weakness and myalgia.  ?Dermatologic: Denied dermatology symptoms, rash and scar.  ?Neurologic: Denied neurology symptoms, dizziness, headache, neck pain and syncope.  ?Psychiatric: Denied psychiatric symptoms, anxiety and depression.  ?Endocrine: Denied endocrine symptoms including hot flashes and night sweats.  ? ?Meds: ?  ?Current Outpatient Medications on File Prior to Visit  ?Medication Sig Dispense Refill  ? albuterol (VENTOLIN HFA) 108 (90 Base) MCG/ACT inhaler Inhale 2 puffs into the lungs every 6 (six) hours as needed for wheezing or shortness of breath. 8 g 2  ? Prenatal Vit-Fe Fumarate-FA (MULTIVITAMIN-PRENATAL) 27-0.8 MG TABS tablet Take 1 tablet by mouth daily at 12 noon.    ? ?No current facility-administered medications on file prior to visit.  ? ? ? ? ?Objective:  ?  ? ?Vitals:  ? 11/05/21 1107  ?BP: 111/60  ?Pulse: 67  ? ?Filed Weights  ? 11/05/21 1107  ?Weight: 145 lb (65.8 kg)  ? ?  ?         Urinary pregnancy test negative!! ?        ? ?Assessment:  ?  ?[id number] ?Patient Active Problem List  ? Diagnosis Date Noted  ? Active labor at term 12/14/2014  ? Labor and delivery, indication for care 12/13/2014  ? Pregnancy 12/03/2014  ? Indication for care in labor and delivery, antepartum 12/03/2014  ? ?  ?  1. Possible pregnancy, not yet confirmed   ?2. Amenorrhea   ? ? Patient not pregnant by urinary pregnancy test ? ? ?Plan:  ?  ?       ? 1.  Serum beta hCG ? 2.  Possible expected management of menses versus progesterone withdrawal bleed.  Patient to inform us after we get the beta-hCG back ? 3.   Possible reappointment for discussion of future birth control methods. ?Orders ?Orders Placed This Encounter  ?Procedures  ? Beta hCG quant (ref lab)  ? POCT urine pregnancy  ? ? No orders of the defined types were placed in this encounter. ?  ?  F/U ? No follow-ups on file. ?I spent 21 minutes involved in the care of this patient preparing to see the patient by obtaining and reviewing her medical history (including labs, imaging tests and prior procedures), documenting clinical information in the electronic health record (EHR), counseling and coordinating care plans, writing and sending prescriptions, ordering tests or procedures and in direct communicating with the patient and medical staff discussing pertinent items from her history and physical exam. ? ?Elonda Husky, M.D. ?11/05/2021 ?11:21 AM ? ? ? ? ?

## 2021-11-05 NOTE — Progress Notes (Signed)
Patient presents today for pregnancy confirmation. UPT resulted in negative. She states taking a home test that was positive last week, states no bleeding since last menstrual which was 09/19/21. Patient states she was not using anything to prevent pregnancy. Patient states no other questions or concerns.  ?

## 2021-11-06 LAB — BETA HCG QUANT (REF LAB): hCG Quant: <1 m[IU]/mL

## 2021-11-11 ENCOUNTER — Emergency Department
Admission: EM | Admit: 2021-11-11 | Discharge: 2021-11-11 | Disposition: A | Discharge status: Home / Self Care | Payer: Medicaid Other | Attending: Student in an Organized Health Care Education/Training Program | Admitting: Student in an Organized Health Care Education/Training Program

## 2021-11-11 ENCOUNTER — Other Ambulatory Visit: Payer: Self-pay

## 2021-11-11 DIAGNOSIS — J039 Acute tonsillitis, unspecified: Secondary | ICD-10-CM | POA: Insufficient documentation

## 2021-11-11 DIAGNOSIS — J029 Acute pharyngitis, unspecified: Secondary | ICD-10-CM | POA: Diagnosis present

## 2021-11-11 MED ORDER — AMOXICILLIN-POT CLAVULANATE 875-125 MG PO TABS: 1.0000 | ORAL_TABLET | Freq: Two times a day (BID) | ORAL | 0 refills | Status: AC

## 2021-11-11 MED ORDER — DEXAMETHASONE 10 MG/ML FOR PEDIATRIC ORAL USE
10.0000 mg | Freq: Once | INTRAMUSCULAR | Status: AC
  Administered 2021-11-11: 10 mg via ORAL
  Filled 2021-11-11: qty 1

## 2021-11-11 MED ORDER — AMOXICILLIN-POT CLAVULANATE 875-125 MG PO TABS
1.0000 | ORAL_TABLET | Freq: Once | ORAL | Status: AC
  Administered 2021-11-11: 1 via ORAL
  Filled 2021-11-11: qty 1

## 2021-11-11 NOTE — ED Triage Notes (Signed)
Pt c/o sore throat for the past 4 days ?

## 2021-11-11 NOTE — ED Provider Notes (Signed)
? ?[facility] ?Provider Note ? ? ? Event Date/Time  ? First MD Initiated Contact with Patient 11/11/21 0715   ?  (approximate) ? ? ?History  ? ?Sore Throat ? ? ?HPI ? ?Katherine Fitzgerald is a 31 y.o. female   without significant past medical history presents to the ER for evaluation of 4 days of sore throat trouble swallowing due to discomfort swollen lymph nodes.  Not currently on any antibiotics.  Has taken over-the-counter medications without much improvement.  Has had some chills but no measured fevers.  No cough no congestion no history of seasonal allergies.  No change in phonation.  No new medications.  No known sick contacts. ? ?  ? ? ?Physical Exam  ? ?Triage Vital Signs: ?ED Triage Vitals  ?Enc Vitals Group  ?   BP 11/11/21 0711 (!) 115/95  ?   Pulse Rate 11/11/21 0711 98  ?   Resp 11/11/21 0711 17  ?   Temp 11/11/21 0711 99.2 ?F (37.3 ?C)  ?   Temp Source 11/11/21 0711 Oral  ?   SpO2 11/11/21 0711 94 %  ?   Weight --   ?   Height --   ?   Head Circumference --   ?   Peak Flow --   ?   Pain Score 11/11/21 0705 10  ?   Pain Loc --   ?   Pain Edu? --   ?   Excl. in [place]? --   ? ? ?Most recent vital signs: ?Vitals:  ? 11/11/21 0711  ?BP: (!) 115/95  ?Pulse: 98  ?Resp: 17  ?Temp: 99.2 ?F (37.3 ?C)  ?SpO2: 94%  ? ? ? ?Constitutional: Alert  ?Eyes: Conjunctivae are normal.  ?Head: Atraumatic. ?Nose: No congestion/rhinnorhea. ?Mouth/Throat: Mucous membranes are moist.  Bilateral lower tonsillar erythema and enlargement.  Uvula is midline no trismus.  Normal phonation.  Few exudate bilaterally. ?Neck: Painless ROM.  ?Cardiovascular:   Good peripheral circulation. ?Respiratory: Normal respiratory effort.  No retractions.  ?Gastrointestinal: non distended ?Musculoskeletal:  no deformity ?Neurologic:  MAE spontaneously. No gross focal neurologic deficits are appreciated.  ?Skin:  Skin is warm, dry and intact. No rash noted. ?Psychiatric: Mood and affect are normal. Speech and behavior are  normal. ? ? ? ?ED Results / Procedures / Treatments  ? ?Labs ?(all labs ordered are listed, but only abnormal results are displayed) ?Labs Reviewed - No data to display ? ? ?EKG ? ? ? ? ?RADIOLOGY ? ? ? ?PROCEDURES: ? ?Critical Care performed:  ? ?Procedures ? ? ?MEDICATIONS ORDERED IN ED: ?Medications  ?dexamethasone (DECADRON) 10 MG/ML injection for Pediatric ORAL use 10 mg (has no administration in time range)  ?amoxicillin-clavulanate (AUGMENTIN) 875-125 MG per tablet 1 tablet (has no administration in time range)  ? ? ? ?IMPRESSION / MDM / ASSESSMENT AND PLAN / ED COURSE  ?I reviewed the triage vital signs and the nursing notes. ?             ?               ? ?Differential diagnosis includes, but is not limited to, strep throat, viral pharyngitis, mono, COVID, flu, PTA, RPA, epiglottitis ? ?Patient presenting with 4 days of sore throat. Patient with low grade temp in ED. Exam as above. Given current presentation have considered the above differential. Not consistent with PTA or RPA as no muffled voice, uvula midline, no asymmetry. trace exudates.  no neck stiffness.  Given duration and exam findings will rx decadron and abx.  Discussed ? ? ? ?  ? ? ?FINAL CLINICAL IMPRESSION(S) / ED DIAGNOSES  ? ?Final diagnoses:  ?Tonsillitis  ? ? ? ?Rx / DC Orders  ? ?ED Discharge Orders   ? ?      Ordered  ?  amoxicillin-clavulanate (AUGMENTIN) 875-125 MG tablet  2 times daily       ? 11/11/21 0722  ? ?  ?  ? ?  ? ? ? ?Note:  This document was prepared using Dragon voice recognition software and may include unintentional dictation errors. ? ?  ?Merlyn Lot, MD ?11/11/21 [phone] ? ?

## 2021-12-10 ENCOUNTER — Encounter: Payer: Medicaid Other | Admitting: Obstetrics and Gynecology

## 2021-12-10 DIAGNOSIS — Z01419 Encounter for gynecological examination (general) (routine) without abnormal findings: Secondary | ICD-10-CM

## 2022-01-08 ENCOUNTER — Emergency Department
Admission: EM | Admit: 2022-01-08 | Discharge: 2022-01-08 | Disposition: A | Discharge status: Home / Self Care | Payer: Medicaid Other | Attending: Student in an Organized Health Care Education/Training Program | Admitting: Student in an Organized Health Care Education/Training Program

## 2022-01-08 ENCOUNTER — Encounter: Payer: Self-pay | Admitting: Emergency Medicine

## 2022-01-08 DIAGNOSIS — J020 Streptococcal pharyngitis: Secondary | ICD-10-CM | POA: Diagnosis not present

## 2022-01-08 DIAGNOSIS — G5602 Carpal tunnel syndrome, left upper limb: Secondary | ICD-10-CM | POA: Insufficient documentation

## 2022-01-08 DIAGNOSIS — J029 Acute pharyngitis, unspecified: Secondary | ICD-10-CM | POA: Diagnosis present

## 2022-01-08 LAB — GROUP A STREP BY PCR: Group A Strep by PCR: DETECTED — AB

## 2022-01-08 MED ORDER — AMOXICILLIN 500 MG PO CAPS
1000.0000 mg | ORAL_CAPSULE | Freq: Once | ORAL | Status: AC
  Administered 2022-01-08: 1000 mg via ORAL
  Filled 2022-01-08: qty 2

## 2022-01-08 MED ORDER — MELOXICAM 15 MG PO TABS: 15.0000 mg | ORAL_TABLET | Freq: Every day | ORAL | 0 refills | Status: DC

## 2022-01-08 MED ORDER — LIDOCAINE VISCOUS HCL 2 % MT SOLN: 10.0000 mL | OROMUCOSAL | 0 refills | Status: DC | PRN

## 2022-01-08 MED ORDER — AMOXICILLIN 875 MG PO TABS: 875.0000 mg | ORAL_TABLET | Freq: Two times a day (BID) | ORAL | 0 refills | Status: DC

## 2022-01-08 NOTE — ED Provider Notes (Cosign Needed)
[facility] Provider Note  Patient Contact: 8:46 PM (approximate)   History   Sore Throat   HPI  Katherine Fitzgerald is a 31 y.o. female who presents to the ED for 2 complaints.  Patient's primary complaint is sore throat.  She states that she has been getting repetitive strep infections this year.  She states that roughly once a month she will have tonsillitis or strep pharyngitis.  Patient is concerned that she may have strep as she has the "white dots" in the tonsils.  Patient is also complaining of some left wrist pain.  She states that she will have pain in the wrist that radiates down to her fingers.  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     Physical Exam   Triage Vital Signs: ED Triage Vitals  Enc Vitals Group     BP 01/08/22 1946 (!) 142/79     Pulse Rate 01/08/22 1946 82     Resp 01/08/22 1946 20     Temp 01/08/22 1946 98.6 F (37 C)     Temp Source 01/08/22 1946 Oral     SpO2 01/08/22 1946 98 %     Weight 01/08/22 1944 160 lb (72.6 kg)     Height 01/08/22 1944 5\' 3"  (1.6 m)     Head Circumference --      Peak Flow --      Pain Score 01/08/22 1943 7     Pain Loc --      Pain Edu? --      Excl. in GC? --     Most recent vital signs: Vitals:   01/08/22 1946  BP: (!) 142/79  Pulse: 82  Resp: 20  Temp: 98.6 F (37 C)  SpO2: 98%     General: Alert and in no acute distress. ENT:      Ears:       Nose: No congestion/rhinnorhea.      Mouth/Throat: Mucous membranes are moist.  Visualization of the tonsils reveals erythema and exudates bilaterally.  No unequal tonsillar hypertrophy and no uvular deviation. Neck: No stridor. No cervical spine tenderness to palpation.  No erythema, edema of the anterior neck no tenderness to the anterior neck. Hematological/Lymphatic/Immunilogical: Scattered anterior cervical lymphadenopathy. Cardiovascular:  Good  peripheral perfusion Respiratory: Normal respiratory effort without tachypnea or retractions. Lungs CTAB. Musculoskeletal: Full range of motion to all extremities.  No visible edema, deformity to the left wrist.  Positive for Phalen's and Tinel's.  Otherwise full range of motion to the digits, good range of motion to the wrist. Neurologic:  No gross focal neurologic deficits are appreciated.  Skin:   No rash noted Other:   ED Results / Procedures / Treatments   Labs (all labs ordered are listed, but only abnormal results are displayed) Labs Reviewed  GROUP A STREP BY PCR - Abnormal; Notable for the following components:      Result Value   Group A Strep by PCR DETECTED (*)    All other components within normal limits     EKG     RADIOLOGY    No results found.  PROCEDURES:  Critical Care performed: No  Procedures   MEDICATIONS ORDERED IN ED: Medications  amoxicillin (AMOXIL) capsule 1,000 mg (has no administration in time range)     IMPRESSION / MDM / ASSESSMENT AND PLAN / ED COURSE  I reviewed the  triage vital signs and the nursing notes.                              Differential diagnosis includes, but is not limited to, strep pharyngitis, tonsillitis, viral illness, wrist fracture, wrist sprain, carpal tunnel   Patient's presentation is most consistent with acute illness / injury with system symptoms.   Patient's diagnosis is consistent with strep pharyngitis, carpal tunnel.  Patient presented with 2 complaints.  Patient has been having recurrent strep or tonsillitis infections almost monthly for the past year.  She is strep positive tonight.  I will start the patient on antibiotics and Magic mouthwash for symptom control.  Given the repetitive nature of her strep and pharyngitis symptoms I do recommend following up with ENT for likely tonsillectomy.  Patient is agreeable with this plan and referral is given.  Patient is also complaining of some left wrist and  hand pain.  Findings on physical exam are consistent with carpal tunnel.  Patient will be given anti-inflammatory and a wrist brace for same.  Follow-up with orthopedics if symptoms or not improving..  Patient is given ED precautions to return to the ED for any worsening or new symptoms.        FINAL CLINICAL IMPRESSION(S) / ED DIAGNOSES   Final diagnoses:  Strep throat  Carpal tunnel syndrome of left wrist     Rx / DC Orders   ED Discharge Orders          Ordered    amoxicillin (AMOXIL) 875 MG tablet  2 times daily        01/08/22 2100    lidocaine (XYLOCAINE) 2 % solution  Every 4 hours PRN        01/08/22 2100    meloxicam (MOBIC) 15 MG tablet  Daily        01/08/22 2100             Note:  This document was prepared using Dragon voice recognition software and may include unintentional dictation errors.   Racheal Patches, PA-C 01/08/22 2100

## 2022-01-08 NOTE — ED Triage Notes (Signed)
Pt presents via POV with complaints of sore throat for the last 2 days. No meds taken PTA. Denies SOB, fevers, N/V.

## 2022-01-08 NOTE — ED Notes (Signed)
Pt declined discharge vitals at this time 

## 2022-03-18 ENCOUNTER — Emergency Department
Admission: EM | Admit: 2022-03-18 | Discharge: 2022-03-18 | Disposition: A | Discharge status: Home / Self Care | Payer: Medicaid Other | Attending: Emergency Medicine | Admitting: Emergency Medicine

## 2022-03-18 ENCOUNTER — Other Ambulatory Visit: Payer: Self-pay

## 2022-03-18 DIAGNOSIS — L03213 Periorbital cellulitis: Secondary | ICD-10-CM | POA: Insufficient documentation

## 2022-03-18 DIAGNOSIS — H5789 Other specified disorders of eye and adnexa: Secondary | ICD-10-CM | POA: Diagnosis present

## 2022-03-18 MED ORDER — LIDOCAINE HCL (PF) 1 % IJ SOLN
1.2000 mL | Freq: Once | INTRAMUSCULAR | Status: AC
  Administered 2022-03-18: 1.2 mL
  Filled 2022-03-18: qty 5

## 2022-03-18 MED ORDER — CEFTRIAXONE SODIUM 1 G IJ SOLR
1.0000 g | Freq: Once | INTRAMUSCULAR | Status: AC
  Administered 2022-03-18: 1 g via INTRAMUSCULAR
  Filled 2022-03-18: qty 10

## 2022-03-18 MED ORDER — SULFAMETHOXAZOLE-TRIMETHOPRIM 800-160 MG PO TABS: 1.0000 | ORAL_TABLET | Freq: Two times a day (BID) | ORAL | 0 refills | Status: AC

## 2022-03-18 NOTE — ED Provider Notes (Signed)
[facility] Provider Note    Event Date/Time   First MD Initiated Contact with Patient 03/18/22 1057     (approximate)   History   Eye Problem   HPI  Katherine Fitzgerald is a 31 y.o. female with history of migraines, SBA, and as listed in EMR presents to the emergency department for treatment and evaluation of left eye swelling. No fever. Daughter had surgery about 2 weeks ago due to orbital cellulitis. No visual changes. No redness. Eyelid feels "full" but no watery or colored drainage.  Past Medical History:  Diagnosis Date   Migraine    Spontaneous abortion 12/2013     Physical Exam   Triage Vital Signs: ED Triage Vitals  Enc Vitals Group     BP 03/18/22 0956 (!) 127/98     Pulse Rate 03/18/22 0956 (!) 51     Resp 03/18/22 0956 18     Temp 03/18/22 0956 98.5 F (36.9 C)     Temp Source 03/18/22 0956 Oral     SpO2 03/18/22 0956 98 %     Weight 03/18/22 0957 147 lb (66.7 kg)     Height 03/18/22 0957 5\' 3"  (1.6 m)     Head Circumference --      Peak Flow --      Pain Score 03/18/22 0957 7     Pain Loc --      Pain Edu? --      Excl. in [place]? --     Most recent vital signs: Vitals:   03/18/22 0956 03/18/22 0957  BP: (!) 127/98   Pulse: (!) 51 65  Resp: 18   Temp: 98.5 F (36.9 C)   SpO2: 98%     General: Awake, no distress.  CV:  Good peripheral perfusion.  Resp:  Normal effort.  Abd:  No distention.  Other:  Mild erythema and edema of left upper eyelid. Pain at end point of upward and leftward gaze.   ED Results / Procedures / Treatments   Labs (all labs ordered are listed, but only abnormal results are displayed) Labs Reviewed - No data to display   EKG  Not indicated.   RADIOLOGY  Not indicated.  I have independently reviewed and interpreted imaging as well as reviewed report from radiology.  PROCEDURES:  Critical Care performed: No  Procedures  Visual acuity results reviewed, unremarkable for urgent  concern.  MEDICATIONS ORDERED IN ED:  Medications  cefTRIAXone (ROCEPHIN) injection 1 g (1 g Intramuscular Given 03/18/22 1145)  lidocaine (PF) (XYLOCAINE) 1 % injection 1.2 mL (1.2 mLs Other Given 03/18/22 1145)     IMPRESSION / MDM / ASSESSMENT AND PLAN / ED COURSE   I reviewed the triage vital signs and the nursing notes.  Differential diagnosis includes, but is not limited to: preorbital cellulitis; orbital cellulitis, hordeolum, stye, blepheraitis  Patient's presentation is most consistent with acute illness / injury with system symptoms.  31 year old female presenting to the emergency department due to left eyelid erythema and edema for the past 2 days.  See HPI for further details.  Exam most consistent with an early preseptal cellulitis.  Given daughter's recent history of orbital cellulitis requiring surgical intervention, patient will be treated with IM Rocephin here and prescribed antibiotics on outpatient basis with strict ER return precautions.  Patient gave verbal permission to look at daughter, Angelica Prado's, culture results in order to ensure that antibiotic selection would cover patient for any bacteria that may have been  present.     FINAL CLINICAL IMPRESSION(S) / ED DIAGNOSES   Final diagnoses:  Preseptal cellulitis of left upper eyelid     Rx / DC Orders   ED Discharge Orders          Ordered    sulfamethoxazole-trimethoprim (BACTRIM DS) 800-160 MG tablet  2 times daily        03/18/22 1207             Note:  This document was prepared using Dragon voice recognition software and may include unintentional dictation errors.   Chinita Pester, FNP 03/18/22 1209    Georga Hacking, MD 03/18/22 [phone]

## 2022-03-18 NOTE — ED Notes (Signed)
First Nurse Note: Pt to ED via POV for swelling and pain in the left eye. Pt states that symptoms started 2 days ago but it is worse today.

## 2022-03-18 NOTE — Discharge Instructions (Signed)
Please take the antibiotic as prescribed and until finished.  Using a warm compress over the area may also help.  Follow-up with primary care if not improving over the next 2 to 3 days.  If symptoms change or worsen you need to return to the emergency department.

## 2022-03-18 NOTE — ED Triage Notes (Signed)
Pt here with a left eye problem.  Pt states she the day before yesterday and woke up to a little swelling to her left eye which has gotten worse over time. Pt states she also feels fluid in her eye with pain.

## 2022-03-18 NOTE — ED Notes (Signed)
Pt A&O, pt given discharge instructions, pt ambulating with steady gait. 

## 2022-06-01 ENCOUNTER — Emergency Department
Admission: EM | Admit: 2022-06-01 | Discharge: 2022-06-01 | Disposition: A | Discharge status: Home / Self Care | Payer: Medicaid Other | Attending: Student in an Organized Health Care Education/Training Program | Admitting: Student in an Organized Health Care Education/Training Program

## 2022-06-01 ENCOUNTER — Other Ambulatory Visit: Payer: Self-pay

## 2022-06-01 ENCOUNTER — Encounter: Payer: Self-pay | Admitting: Emergency Medicine

## 2022-06-01 DIAGNOSIS — J020 Streptococcal pharyngitis: Secondary | ICD-10-CM | POA: Diagnosis not present

## 2022-06-01 DIAGNOSIS — J029 Acute pharyngitis, unspecified: Secondary | ICD-10-CM | POA: Diagnosis present

## 2022-06-01 LAB — GROUP A STREP BY PCR: Group A Strep by PCR: DETECTED — AB

## 2022-06-01 MED ORDER — DEXAMETHASONE 4 MG PO TABS
10.0000 mg | ORAL_TABLET | Freq: Once | ORAL | Status: AC
  Administered 2022-06-01: 10 mg via ORAL
  Filled 2022-06-01: qty 3

## 2022-06-01 MED ORDER — AMOXICILLIN 500 MG PO CAPS: 500.0000 mg | ORAL_CAPSULE | Freq: Three times a day (TID) | ORAL | 0 refills | Status: AC

## 2022-06-01 NOTE — ED Triage Notes (Signed)
C/O sore throat.  STates has had tonsillitis and strep a couple of times this year, but this feels different.   C?O right side of throat pain, burning pain , worse when swallowing.  Also pain up to right ear.  Denies Fever, vomiting, diarrhea.  AAOx3.  Skin warm and dry. NAD.  Voice clear and strong.  Managing oral secretions well.

## 2022-06-01 NOTE — ED Provider Notes (Signed)
[facility] Provider Note    Event Date/Time   First MD Initiated Contact with Patient 06/01/22 1111     (approximate)   History   No chief complaint on file.   HPI  Katherine Fitzgerald is a 31 y.o. female presents to the ER for evaluation of several days of sore throat.  Pain with swallowing describes it as burning sensation.  No fevers or chills no cough no nausea or vomiting no chest pain.  No shortness of breath.  Has had frequent episodes of pharyngitis in the past.     Physical Exam   Triage Vital Signs: ED Triage Vitals  Enc Vitals Group     BP 06/01/22 1033 111/82     Pulse Rate 06/01/22 1033 64     Resp 06/01/22 1033 16     Temp 06/01/22 1033 (!) 97.5 F (36.4 C)     Temp Source 06/01/22 1033 Oral     SpO2 06/01/22 1033 100 %     Weight 06/01/22 1031 147 lb 0.8 oz (66.7 kg)     Height 06/01/22 1031 5\' 3"  (1.6 m)     Head Circumference --      Peak Flow --      Pain Score 06/01/22 1031 8     Pain Loc --      Pain Edu? --      Excl. in GC? --     Most recent vital signs: Vitals:   06/01/22 1033  BP: 111/82  Pulse: 64  Resp: 16  Temp: (!) 97.5 F (36.4 C)  SpO2: 100%     Constitutional: Alert  Eyes: Conjunctivae are normal.  Head: Atraumatic. Nose: No congestion/rhinnorhea. Mouth/Throat: Mucous membranes are moist.  Bilateral tonsillar erythema with a few exudates uvula is midline.  No trismus. Neck: Painless ROM.  Cardiovascular:   Good peripheral circulation. Respiratory: Normal respiratory effort.  No retractions.  Gastrointestinal: Soft and nontender.  Musculoskeletal:  no deformity Neurologic:  MAE spontaneously. No gross focal neurologic deficits are appreciated.  Skin:  Skin is warm, dry and intact. No rash noted. Psychiatric: Mood and affect are normal. Speech and behavior are normal.    ED Results / Procedures / Treatments   Labs (all labs ordered are listed, but only abnormal results are displayed) Labs  Reviewed  GROUP A STREP BY PCR - Abnormal; Notable for the following components:      Result Value   Group A Strep by PCR DETECTED (*)    All other components within normal limits     EKG     RADIOLOGY    PROCEDURES:  Critical Care performed:   Procedures   MEDICATIONS ORDERED IN ED: Medications  dexamethasone (DECADRON) tablet 10 mg (has no administration in time range)     IMPRESSION / MDM / ASSESSMENT AND PLAN / ED COURSE  I reviewed the triage vital signs and the nursing notes.                              Differential diagnosis includes, but is not limited to, uri, pta, rpa, phayngitis  Patient presented to the ER for evaluation of symptoms consistent with pharyngitis.  Patient will be given Decadron as well as antibiotics.  We discussed strict return precautions.  Patient agreeable to plan.      FINAL CLINICAL IMPRESSION(S) / ED DIAGNOSES   Final diagnoses:  Strep pharyngitis  Rx / DC Orders   ED Discharge Orders          Ordered    amoxicillin (AMOXIL) 500 MG capsule  3 times daily        06/01/22 1118             Note:  This document was prepared using Dragon voice recognition software and may include unintentional dictation errors.    Willy Eddy, MD 06/01/22 1120

## 2022-08-31 IMAGING — US US PELVIS COMPLETE TRANSABD/TRANSVAG W DUPLEX
1 series · 13 of 25 positions shown · non-contrast
Comparison: None.

CLINICAL DATA: Pelvic pain for 1 day.

EXAM:
TRANSABDOMINAL AND TRANSVAGINAL ULTRASOUND OF PELVIS
DOPPLER ULTRASOUND OF OVARIES
TECHNIQUE: Both transabdominal and transvaginal ultrasound examinations of the
pelvis were performed. Transabdominal technique was performed for
global imaging of the pelvis including uterus, ovaries, adnexal
regions, and pelvic cul-de-sac.
It was necessary to proceed with endovaginal exam following the
transabdominal exam to visualize the adnexal structures to an
adequate degree. Color and duplex Doppler ultrasound was utilized to
evaluate blood flow to the ovaries.

[Series 1: us pelvic complete w transvaginal and torsion righ · 13 of 89 slices shown]
[im 1/89]
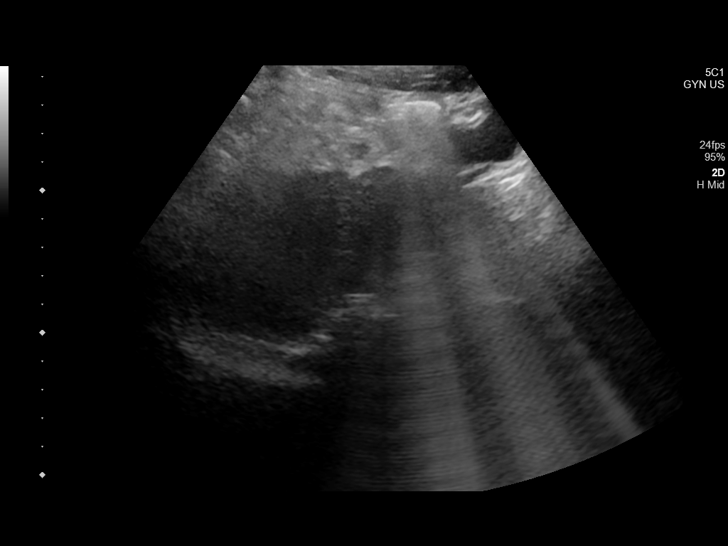
[im 8/89]
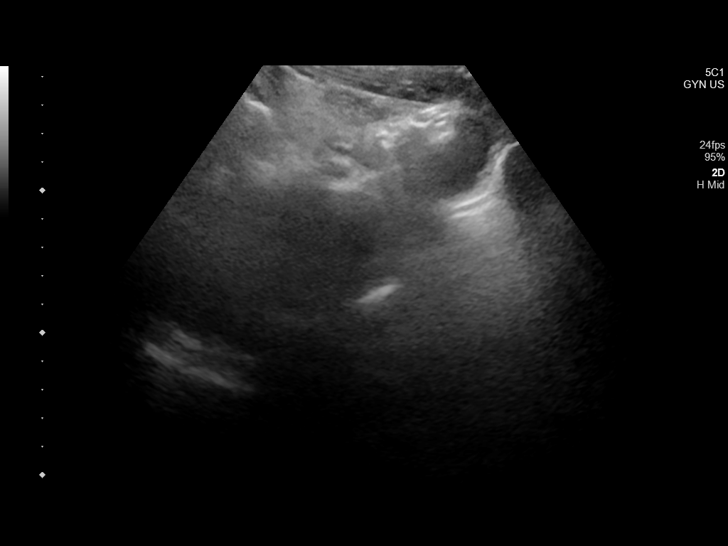
[im 15/89]
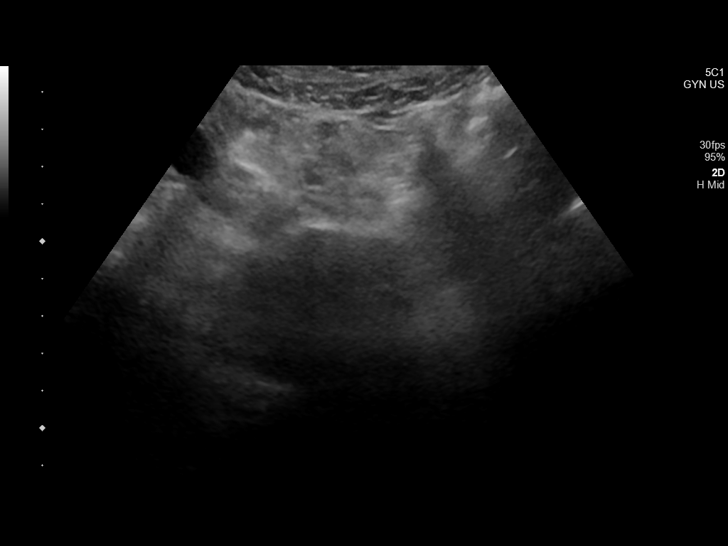
[im 23/89]
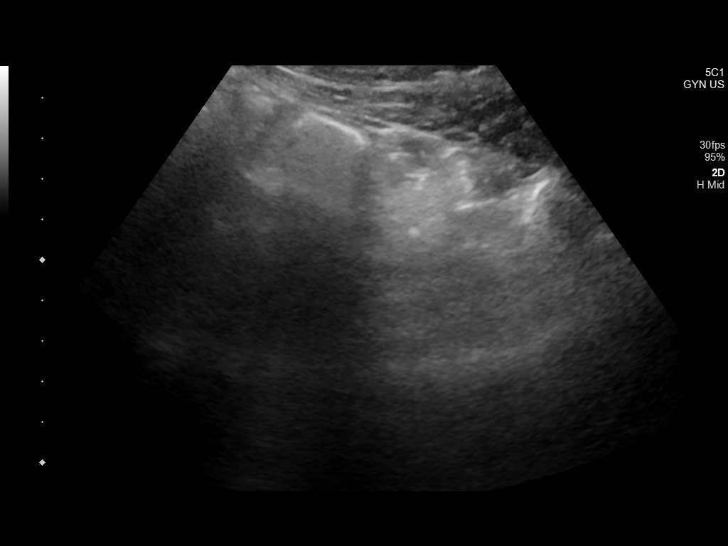
[im 30/89]
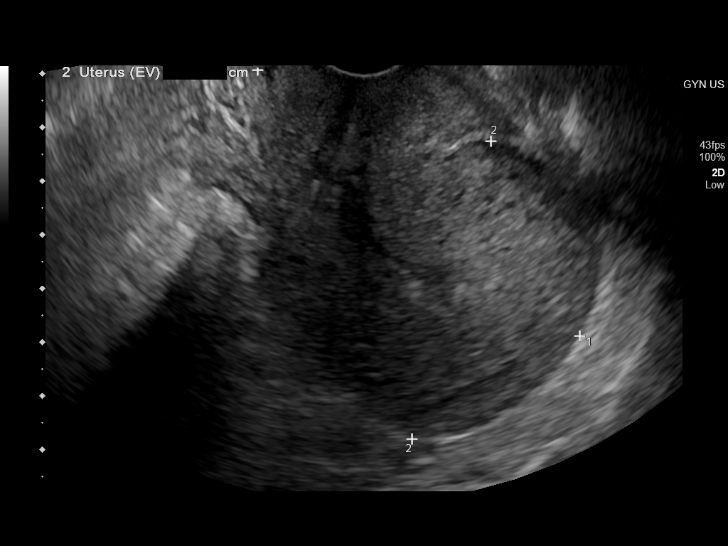
[im 37/89]
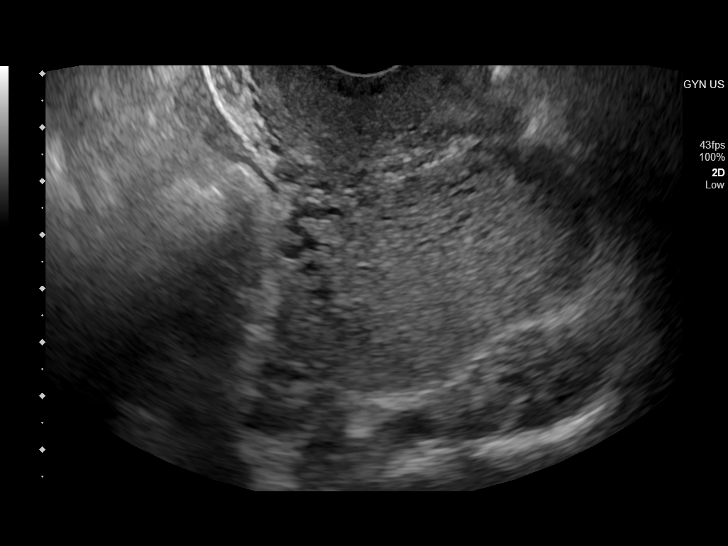
[im 45/89]
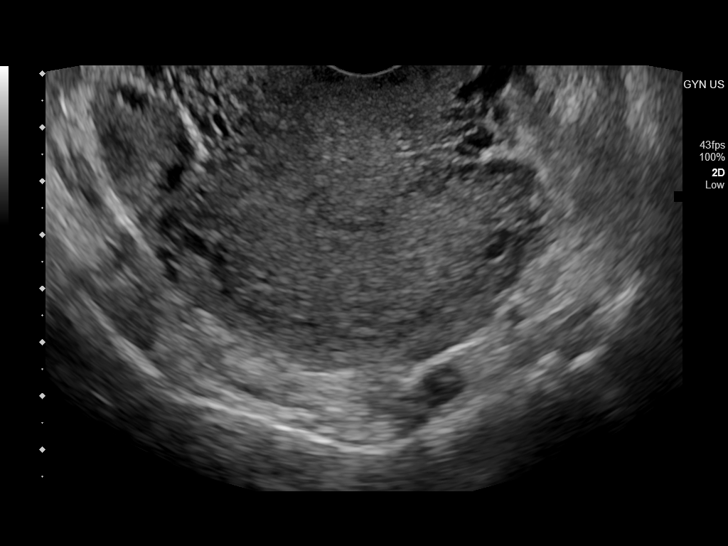
[im 52/89]
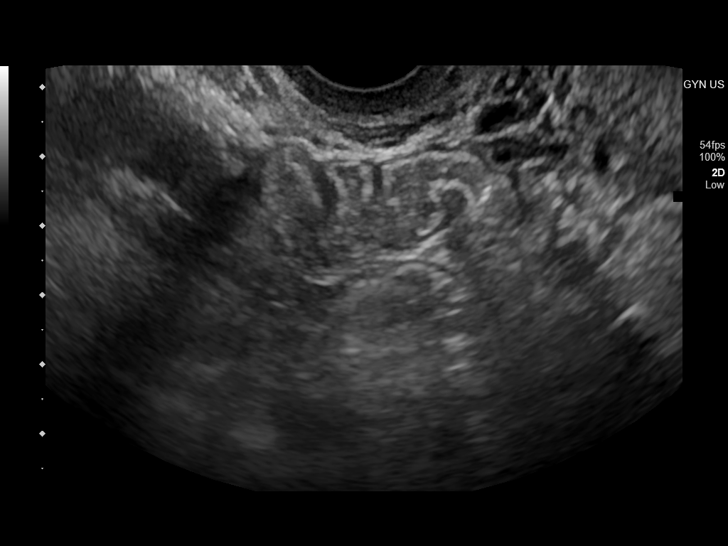
[im 59/89]
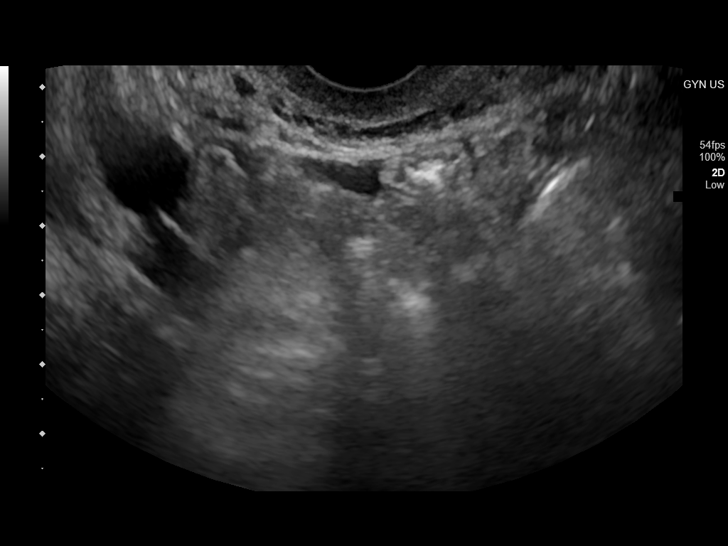
[im 67/89]
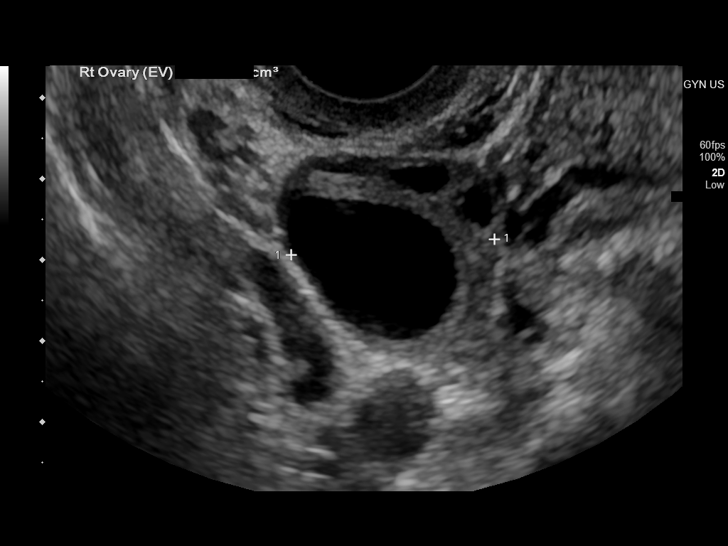
[im 74/89]
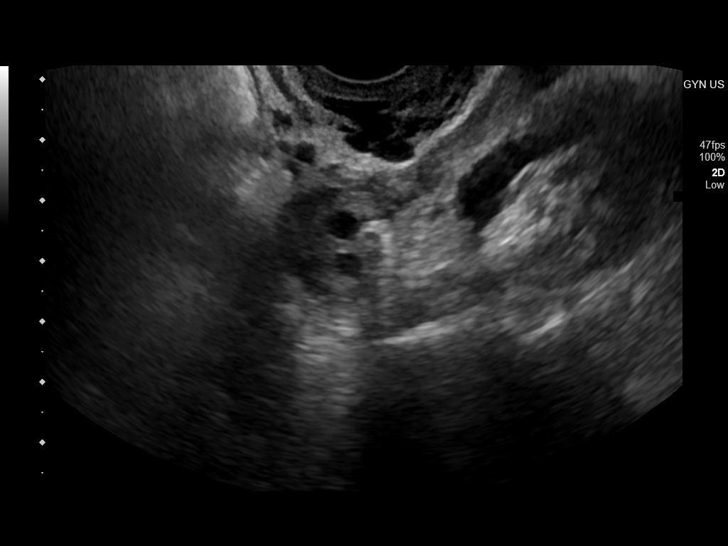
[im 81/89]
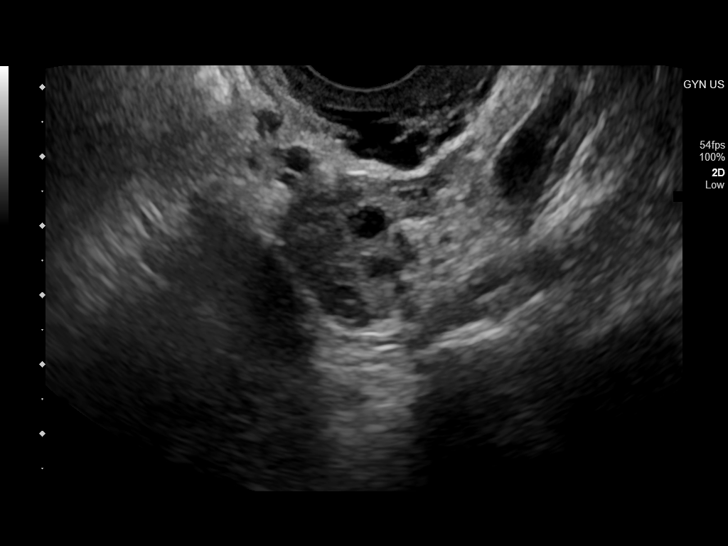
[im 89/89]
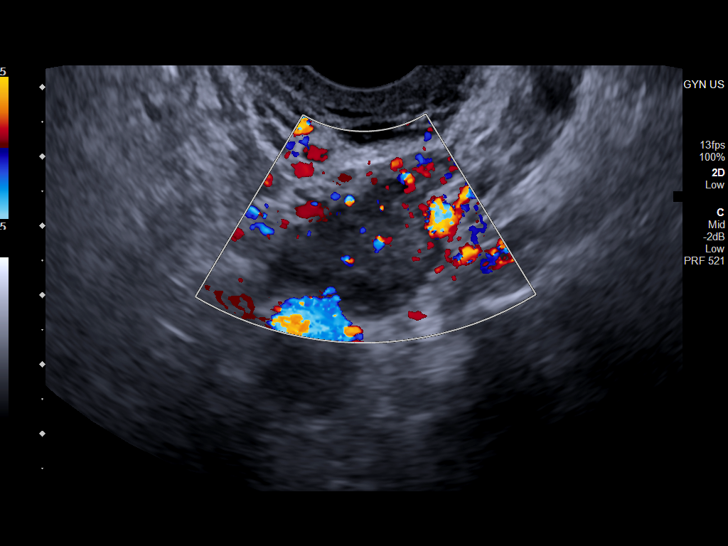

[13 of 25 positions shown; findings below may reference images not displayed]

FINDINGS: Uterus

Measurements: 7.8 x 5.7 x 6.5 cm = volume: 151 mL. No fibroids or
other mass visualized.

Endometrium

Thickness: 8 mm.  No focal abnormality visualized.

Right ovary

Measurements: 3 x 2.6 x 2.5 cm = volume: 10.1 mL. RIGHT ovary
contains a normal dominant follicle that measures 2.2 cm.

Left ovary

Measurements: 2.1 x 1.6 x 1.9 cm = volume: 3.4 mL. Normal
appearance/no adnexal mass.

Pulsed Doppler evaluation of both ovaries demonstrates normal
low-resistance arterial and venous waveforms.

Other findings

No abnormal free fluid.
IMPRESSION: Normal pelvic ultrasound. No evidence of ovarian torsion. No mass or
free fluid within either adnexal region.

## 2022-11-24 LAB — HM HIV SCREENING LAB: HM HIV Screening: NEGATIVE

## 2022-11-25 ENCOUNTER — Ambulatory Visit: Payer: Medicaid Other | Admitting: Family Medicine

## 2022-11-25 ENCOUNTER — Other Ambulatory Visit: Payer: Self-pay

## 2022-11-25 ENCOUNTER — Encounter: Payer: Self-pay | Admitting: Emergency Medicine

## 2022-11-25 ENCOUNTER — Emergency Department: Payer: Medicaid Other

## 2022-11-25 ENCOUNTER — Emergency Department
Admission: EM | Admit: 2022-11-25 | Discharge: 2022-11-25 | Disposition: A | Discharge status: Home / Self Care | Payer: Medicaid Other | Attending: Emergency Medicine | Admitting: Emergency Medicine

## 2022-11-25 ENCOUNTER — Encounter: Payer: Self-pay | Admitting: Family Medicine

## 2022-11-25 DIAGNOSIS — Z113 Encounter for screening for infections with a predominantly sexual mode of transmission: Secondary | ICD-10-CM

## 2022-11-25 DIAGNOSIS — M25532 Pain in left wrist: Secondary | ICD-10-CM | POA: Insufficient documentation

## 2022-11-25 DIAGNOSIS — R202 Paresthesia of skin: Secondary | ICD-10-CM | POA: Diagnosis not present

## 2022-11-25 LAB — WET PREP FOR TRICH, YEAST, CLUE
Trichomonas Exam: NEGATIVE
Yeast Exam: NEGATIVE

## 2022-11-25 NOTE — Progress Notes (Signed)
Pt is here for STD screening.  Wet mount results reviewed, no treatment required.   Condoms declined.  Annleigh Knueppel M Alexander Mcauley, RN  

## 2022-11-25 NOTE — Progress Notes (Signed)
[facility] Department  STI clinic/screening visit [address] [place] [address] [phone]  Subjective:  Katherine Fitzgerald is a 32 y.o. female being seen today for an STI screening visit. The patient reports they do not have symptoms.  Patient reports that they do not desire a pregnancy in the next year.   They reported they are not interested in discussing contraception today.    Patient's last menstrual period was 11/05/2022 (approximate).  Patient has the following medical conditions:   Patient Active Problem List   Diagnosis Date Noted   Active labor at term 12/14/2014   Labor and delivery, indication for care 12/13/2014   Pregnancy 12/03/2014   Indication for care in labor and delivery, antepartum 12/03/2014    Chief Complaint  Patient presents with   SEXUALLY TRANSMITTED DISEASE    Screening    HPI  Patient reports to clinic for STI testing. Denies any symptoms. States her husband "slept with a bitch and is now screaming chlamydia".   Does the patient using douching products? No  Last HIV test per patient/review of record was No results found for: "HMHIVSCREEN"  Lab Results  Component Value Date   HIV Non Reactive 12/15/2014   Patient reports last pap was  Lab Results  Component Value Date   DIAGPAP  10/29/2020    - Negative for intraepithelial lesion or malignancy (NILM)   No results found for: "SPECADGYN"  Screening for MPX risk: Does the patient have an unexplained rash? No Is the patient MSM? No Does the patient endorse multiple sex partners or anonymous sex partners? No Did the patient have close or sexual contact with a person diagnosed with MPX? No Has the patient traveled outside the [place] where MPX is endemic? No Is there a high clinical suspicion for MPX-- evidenced by one of the following No  -Unlikely to be chickenpox  -Lymphadenopathy  -Rash that present in same phase of evolution on any given body part See flowsheet  for further details and programmatic requirements.   Immunization history:  Immunization History  Administered Date(s) Administered   Tdap 04/16/2021     The following portions of the patient's history were reviewed and updated as appropriate: allergies, current medications, past medical history, past social history, past surgical history and problem list.  Objective:  There were no vitals filed for this visit.  Physical Exam Vitals and nursing note reviewed.  Constitutional:      Appearance: Normal appearance.  HENT:     Head: Normocephalic and atraumatic.     Mouth/Throat:     Mouth: Mucous membranes are moist.     Pharynx: Oropharynx is clear. No oropharyngeal exudate or posterior oropharyngeal erythema.  Pulmonary:     Effort: Pulmonary effort is normal.  Abdominal:     General: Abdomen is flat.     Palpations: There is no mass.     Tenderness: There is no abdominal tenderness. There is no rebound.  Genitourinary:    Comments: Declined genital exam- self swabbed  Lymphadenopathy:     Head:     Right side of head: No preauricular or posterior auricular adenopathy.     Left side of head: No preauricular or posterior auricular adenopathy.     Cervical: No cervical adenopathy.     Upper Body:     Right upper body: No supraclavicular, axillary or epitrochlear adenopathy.     Left upper body: No supraclavicular, axillary or epitrochlear adenopathy.  Skin:    General:  Skin is warm and dry.     Findings: No rash.  Neurological:     Mental Status: She is alert and oriented to person, place, and time.    Assessment and Plan:  Katherine Fitzgerald is a 32 y.o. female presenting to the [facility] Department for STI screening  1. Screening for venereal disease  - HIV [facility] - Syphilis Serology, [facility] - Chlamydia/Gonorrhea  Lab - WET PREP FOR TRICH, YEAST, CLUE - Gonococcus culture   Patient accepted all screenings including oral, vaginal  CT/GC and bloodwork for HIV/RPR, and wet prep. Patient meets criteria for HepB screening? No. Ordered? not applicable Patient meets criteria for HepC screening? No. Ordered? not applicable  Treat wet prep per standing order Discussed time line for [facility] results and that patient will be called with positive results and encouraged patient to call if she had not heard in 2 weeks.  Counseled to return or seek care for continued or worsening symptoms Recommended repeat testing in 3 months with positive results. Recommended condom use with all sex  Patient is currently using  nothing  to prevent pregnancy.    Return if symptoms worsen or fail to improve, for STI screening.  No future appointments. Total time spent 15 minutes  Lenice Llamas, [place]

## 2022-11-25 NOTE — Discharge Instructions (Addendum)
Rest, ice, elevate your wrist.  Wear the splint during the day and remove at night.  Please follow-up with orthopedics if your symptoms persist.  It is a pleasure caring for you today.

## 2022-11-25 NOTE — ED Triage Notes (Signed)
Pt to ED via POV c/o left wrist pain. Pt states that she has hx/o Carpal tunnel syndrome. Pt reports that her wrist flared up about 3 days ago. And she has ben wearing her brace but it is not helping.

## 2022-11-25 NOTE — ED Provider Notes (Signed)
[facility] Provider Note    Event Date/Time   First MD Initiated Contact with Patient 11/25/22 1231     (approximate)   History   Wrist Pain (/)   HPI  Katherine Fitzgerald is a 32 y.o. female who presents today for evaluation of left wrist pain.  Patient reports that the pain is intermittent, though has become more constant over the last couple of days.  She reports occasional tingling in the tips of her second, third, fourth, and fifth fingers though not always.  She reports that most of her pain is over the top dorsum of her wrist.  She has not noticed any swelling or skin changes.  She denies any specific injury.  No weakness.  There are no problems to display for this patient.         Physical Exam   Triage Vital Signs: ED Triage Vitals [11/25/22 1225]  Enc Vitals Group     BP 107/65     Pulse Rate 77     Resp 18     Temp 98.7 F (37.1 C)     Temp Source Oral     SpO2 98 %     Weight 148 lb (67.1 kg)     Height 5\' 2"  (1.575 m)     Head Circumference      Peak Flow      Pain Score 6     Pain Loc      Pain Edu?      Excl. in GC?     Most recent vital signs: Vitals:   11/25/22 1225  BP: 107/65  Pulse: 77  Resp: 18  Temp: 98.7 F (37.1 C)  SpO2: 98%    Physical Exam Vitals and nursing note reviewed.  Constitutional:      General: Awake and alert. No acute distress.    Appearance: Normal appearance. The patient is normal weight.  HENT:     Head: Normocephalic and atraumatic.     Mouth: Mucous membranes are moist.  Eyes:     General: PERRL. Normal EOMs        Right eye: No discharge.        Left eye: No discharge.     Conjunctiva/sclera: Conjunctivae normal.  Cardiovascular:     Rate and Rhythm: Normal rate and regular rhythm.     Pulses: Normal pulses.  Pulmonary:     Effort: Pulmonary effort is normal. No respiratory distress.     Breath sounds: Normal breath sounds.  Abdominal:     Abdomen is soft. There is no  abdominal tenderness. No rebound or guarding. No distention. Musculoskeletal:        General: No swelling. Normal range of motion.     Cervical back: Normal range of motion and neck supple.  Left wrist: Tenderness to the dorsum of the wrist without swelling or edema.  Patient has full range of motion of thumb and fingers, though has pain with abduction and adduction of thumb.  She reports mild tingling to the tips of her fingers occasionally, though none currently.  Negative Tinel's sign at the wrist.  Negative Tinel sign at the elbow.  Negative Phalen test.  Patient is able to actively and passively flex and extend wrist, though reports pain with doing so. Skin:    General: Skin is warm and dry.     Capillary Refill: Capillary refill takes less than 2 seconds.     Findings: No rash.  Neurological:  Mental Status: The patient is awake and alert.      ED Results / Procedures / Treatments   Labs (all labs ordered are listed, but only abnormal results are displayed) Labs Reviewed - No data to display   EKG     RADIOLOGY I independently reviewed and interpreted imaging and agree with radiologists findings.    PROCEDURES:  Critical Care performed:   Procedures   MEDICATIONS ORDERED IN ED: Medications - No data to display   IMPRESSION / MDM / ASSESSMENT AND PLAN / ED COURSE  I reviewed the triage vital signs and the nursing notes.   Differential diagnosis includes, but is not limited to, fracture, contusion, tendinitis, carpal tunnel . Patient is awake and alert, hemodynamically stable and neurovascularly intact.  She has tenderness over the dorsum of her wrist, no palmar tenderness.  X-ray obtained in triage is negative. She has full and normal range of motion of all of her fingers, able to abduct and adduct thumb normally, though has pain with abduction and abduction of thumb.  Mild discomfort with Lourena Simmonds test.  Negative Tinel sign at the wrist and elbow, negative  Phalen test, I do not feel this is consistent with carpal tunnel syndrome.  Findings more suspicious for decor veins tenosynovitis.  She was placed in a thumb spica splint, and we discussed rest, ice, elevation and nonsteroidal anti-inflammatories.  She was instructed to follow-up with orthopedics.  Discussed return precautions and the importance of close outpatient follow-up.  Patient understands and agrees with plan.  She was discharged in stable condition.   Patient's presentation is most consistent with acute complicated illness / injury requiring diagnostic workup.   FINAL CLINICAL IMPRESSION(S) / ED DIAGNOSES   Final diagnoses:  Acute pain of left wrist     Rx / DC Orders   ED Discharge Orders     None        Note:  This document was prepared using Dragon voice recognition software and may include unintentional dictation errors.   Keturah Shavers 11/25/22 1500    Chesley Noon, MD 11/25/22 [phone]

## 2022-11-29 LAB — GONOCOCCUS CULTURE

## 2023-01-06 ENCOUNTER — Emergency Department: Payer: Medicaid Other

## 2023-01-06 ENCOUNTER — Other Ambulatory Visit: Payer: Self-pay

## 2023-01-06 ENCOUNTER — Emergency Department
Admission: EM | Admit: 2023-01-06 | Discharge: 2023-01-06 | Disposition: A | Discharge status: Home / Self Care | Payer: Medicaid Other | Attending: Emergency Medicine | Admitting: Emergency Medicine

## 2023-01-06 DIAGNOSIS — G5603 Carpal tunnel syndrome, bilateral upper limbs: Secondary | ICD-10-CM | POA: Diagnosis not present

## 2023-01-06 DIAGNOSIS — R110 Nausea: Secondary | ICD-10-CM | POA: Insufficient documentation

## 2023-01-06 DIAGNOSIS — M25532 Pain in left wrist: Secondary | ICD-10-CM | POA: Diagnosis present

## 2023-01-06 DIAGNOSIS — R0602 Shortness of breath: Secondary | ICD-10-CM | POA: Insufficient documentation

## 2023-01-06 DIAGNOSIS — R42 Dizziness and giddiness: Secondary | ICD-10-CM | POA: Diagnosis not present

## 2023-01-06 LAB — CBC WITH DIFFERENTIAL/PLATELET
Abs Immature Granulocytes: 0.03 10*3/uL (ref 0.00–0.07)
Basophils Absolute: 0.1 10*3/uL (ref 0.0–0.1)
Basophils Relative: 1 %
Eosinophils Absolute: 0.2 10*3/uL (ref 0.0–0.5)
Eosinophils Relative: 3 %
HCT: 40.1 % (ref 36.0–46.0)
Hemoglobin: 13.6 g/dL (ref 12.0–15.0)
Immature Granulocytes: 0 %
Lymphocytes Relative: 42 %
Lymphs Abs: 3 10*3/uL (ref 0.7–4.0)
MCH: 30.7 pg (ref 26.0–34.0)
MCHC: 33.9 g/dL (ref 30.0–36.0)
MCV: 90.5 fL (ref 80.0–100.0)
Monocytes Absolute: 0.6 10*3/uL (ref 0.1–1.0)
Monocytes Relative: 9 %
Neutro Abs: 3.1 10*3/uL (ref 1.7–7.7)
Neutrophils Relative %: 45 %
Platelets: 268 10*3/uL (ref 150–400)
RBC: 4.43 MIL/uL (ref 3.87–5.11)
RDW: 12.8 % (ref 11.5–15.5)
WBC: 7 10*3/uL (ref 4.0–10.5)
nRBC: 0 % (ref 0.0–0.2)

## 2023-01-06 LAB — COMPREHENSIVE METABOLIC PANEL
ALT: 13 U/L (ref 0–44)
AST: 19 U/L (ref 15–41)
Albumin: 4.1 g/dL (ref 3.5–5.0)
Alkaline Phosphatase: 60 U/L (ref 38–126)
Anion gap: 11 (ref 5–15)
BUN: 13 mg/dL (ref 6–20)
CO2: 20 mmol/L — ABNORMAL LOW (ref 22–32)
Calcium: 8.7 mg/dL — ABNORMAL LOW (ref 8.9–10.3)
Chloride: 105 mmol/L (ref 98–111)
Creatinine, Ser: 0.55 mg/dL (ref 0.44–1.00)
GFR, Estimated: >60 mL/min (ref >60)
Glucose, Bld: 91 mg/dL (ref 70–99)
Potassium: 3.7 mmol/L (ref 3.5–5.1)
Sodium: 136 mmol/L (ref 135–145)
Total Bilirubin: 0.5 mg/dL (ref 0.3–1.2)
Total Protein: 7 g/dL (ref 6.5–8.1)

## 2023-01-06 LAB — PREGNANCY, URINE: Preg Test, Ur: NEGATIVE

## 2023-01-06 MED ORDER — MELOXICAM 15 MG PO TABS
15.0000 mg | ORAL_TABLET | Freq: Every day | ORAL | 0 refills | Status: AC
Start: 2023-01-06 — End: 2023-01-20

## 2023-01-06 NOTE — ED Triage Notes (Signed)
Pt to ED for bilateral wrist pain for a couple weeks. Also reports intermittent nausea for the past couple days. Recently seen for wrist pain

## 2023-01-06 NOTE — ED Provider Notes (Signed)
[facility] Provider Note    Event Date/Time   First MD Initiated Contact with Patient 01/06/23 1501     (approximate)   History   Wrist Pain and Nausea   HPI  Katherine Fitzgerald is a 32 y.o. female with PMH of migraines and wrist pain who presents for evaluation of chronic wrist pain, nausea, SOB and dizziness.    Patient states that she came to the ED over a month ago and was treated for de Quervain's tenosynovitis with a thumb spica splint.  She stated that she did have resolution of her symptoms but that she began to have wrist pain again once she stopped wearing the brace.  She reports that she has had wrist pain in both the left and right on and off and describes her pain as being the most severe at the extremes of wrist flexion and extension.  She also endorses numbness and tingling in her fingertips.  She has been managing her pain with Tylenol, ibuprofen and Goody powder.  Rotating between the 3 as she feels she develops a immunity to them.  States that for the last couple of days she has had increased nausea, dizziness and shortness of breath.  She reports that she feels dizzy when she bends down to pick something up from the ground and then stands up again.  She believes that the SOB and nausea started around the same time as the dizziness.  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     Physical Exam   Triage Vital Signs: ED Triage Vitals [01/06/23 1406]  Enc Vitals Group     BP 129/79     Pulse Rate 64     Resp 16     Temp 98.4 F (36.9 C)     Temp src      SpO2 100 %     Weight 155 lb (70.3 kg)     Height 5\' 1"  (1.549 m)     Head Circumference      Peak Flow      Pain Score 7     Pain Loc      Pain Edu?      Excl. in GC?     Most recent vital signs: Vitals:   01/06/23 1507 01/06/23 1723  BP: 94/71 129/89  Pulse: 66 60  Resp: 15 14  Temp: 97.8 F (36.6 C) 97.8 F (36.6  C)  SpO2: 100% 100%     General: Awake, no distress.  CV:  Good peripheral perfusion. RRR. Resp:  Normal effort. CTAB. Abd:  No distention.  Other:  TTP at the wrist joint.  Full ROM but pain in the extremes of wrist flexion and extension.  Positive wrist Tinel sign, negative Finklestein's test.   ED Results / Procedures / Treatments   Labs (all labs ordered are listed, but only abnormal results are displayed) Labs Reviewed  COMPREHENSIVE METABOLIC PANEL - Abnormal; Notable for the following components:      Result Value   CO2 20 (*)    Calcium 8.7 (*)    All other components within normal limits  CBC WITH DIFFERENTIAL/PLATELET  PREGNANCY, URINE     EKG  EKG showed normal sinus rhythm with a sinus arrhythmia.   RADIOLOGY  Chest x-ray obtained in the ED today.  I interpreted the images as well as reviewed the radiologist report.   PROCEDURES:  Critical Care performed:  No  Procedures   MEDICATIONS ORDERED IN ED: Medications - No data to display   IMPRESSION / MDM / ASSESSMENT AND PLAN / ED COURSE  I reviewed the triage vital signs and the nursing notes.                              Patient presented to the ED with complaints of chronic wrist pain, new onset nausea, SOB and dizziness. Differential diagnosis includes, but is not limited to, ganglion cyst, carpal tunnel syndrome, cubital tunnel syndrome, BPPV, dehydration, PE, pregnancy.  Patient's presentation is most consistent with acute complicated illness / injury requiring diagnostic workup.  Patient has been evaluated for wrist pain by this ED previously.  Given her description of symptoms and resolution of pain with immobilization I believe this is most consistent with carpal tunnel syndrome.  I will prescribe a course of meloxicam to help with the inflammation and advised her to continue wearing a wrist brace.  I also emphasized the importance of following up with orthopedics.  Blood work, chest x-ray  and EKG ordered to evaluate patient's nausea, dizziness and SOB.  I was able to rule out the possibility of a PE with the [facility] rule. Workup was negative, so I advised patient to continue to monitor her symptoms and follow-up with primary care if they continue.  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  Patient was agreeable to plan, voiced understanding and all questions were answered.  Patient was stable at discharge.   FINAL CLINICAL IMPRESSION(S) / ED DIAGNOSES   Final diagnoses:  Carpal tunnel syndrome on both sides     Rx / DC Orders   ED Discharge Orders          Ordered    meloxicam (MOBIC) 15 MG tablet  Daily        01/06/23 1712             Note:  This document was prepared using Dragon voice recognition software and may include unintentional dictation errors.   Cameron Ali, PA-C 01/06/23 1737    Chesley Noon, MD 01/06/23 [phone]

## 2023-01-06 NOTE — ED Notes (Addendum)
See triage note. Patient complaining of both wrists hurting when moving them in certain motions. States she has been here recently for one of her wrists but not it is both when working [employer] pl[employer]. Also has complaints of nausea/dizzy the past three days especially when bending over and standing up. Nausea and dizziness is intermittent. Also states has been feeling tingling in fingertips at times.

## 2023-02-01 ENCOUNTER — Other Ambulatory Visit: Payer: Self-pay

## 2023-02-01 ENCOUNTER — Emergency Department
Admission: EM | Admit: 2023-02-01 | Discharge: 2023-02-01 | Disposition: A | Discharge status: Home / Self Care | Payer: Medicaid Other | Source: Home / Self Care | Attending: Emergency Medicine | Admitting: Emergency Medicine

## 2023-02-01 DIAGNOSIS — R21 Rash and other nonspecific skin eruption: Secondary | ICD-10-CM | POA: Diagnosis present

## 2023-02-01 DIAGNOSIS — L247 Irritant contact dermatitis due to plants, except food: Secondary | ICD-10-CM | POA: Diagnosis not present

## 2023-02-01 MED ORDER — PREDNISONE 10 MG PO TABS: ORAL_TABLET | ORAL | 0 refills | Status: AC

## 2023-02-01 NOTE — ED Triage Notes (Signed)
Itchy red rash to legs, abdomen, wrist x 2 days.  Rash is spreading.

## 2023-02-01 NOTE — ED Provider Notes (Signed)
   [facility] Provider Note    Event Date/Time   First MD Initiated Contact with Patient 02/01/23 1051     (approximate)   History   Rash   HPI  Katherine Fitzgerald is a 32 y.o. female who presents with complaints of rash primarily in the lower extremities.  She thinks may be related to poison ivy, she reports it is itchy has been there for several days     Physical Exam   Triage Vital Signs: ED Triage Vitals  Encounter Vitals Group     BP 02/01/23 1008 113/82     Systolic BP Percentile --      Diastolic BP Percentile --      Pulse Rate 02/01/23 1007 (!) 59     Resp 02/01/23 1007 16     Temp 02/01/23 1007 98.2 F (36.8 C)     Temp Source 02/01/23 1007 Oral     SpO2 02/01/23 1007 98 %     Weight 02/01/23 1006 70.3 kg (154 lb 15.7 oz)     Height 02/01/23 1006 1.549 m (5\' 1" )     Head Circumference --      Peak Flow --      Pain Score 02/01/23 1005 0     Pain Loc --      Pain Education --      Exclude from Growth Chart --     Most recent vital signs: Vitals:   02/01/23 1007 02/01/23 1008  BP:  113/82  Pulse: (!) 59   Resp: 16   Temp: 98.2 F (36.8 C)   SpO2: 98%      General: Awake, no distress.  CV:  Good peripheral perfusion.  Resp:  Normal effort.  Abd:  No distention.  Other:  Rash consistent with contact dermatitis, primarily in the right leg but also on the left leg and right wrist   ED Results / Procedures / Treatments   Labs (all labs ordered are listed, but only abnormal results are displayed) Labs Reviewed - No data to display   EKG     RADIOLOGY     PROCEDURES:  Critical Care performed:   Procedures   MEDICATIONS ORDERED IN ED: Medications - No data to display   IMPRESSION / MDM / ASSESSMENT AND PLAN / ED COURSE  I reviewed the triage vital signs and the nursing notes. Patient's presentation is most consistent with acute, uncomplicated illness.  Patient's exam is consistent with contact  dermatitis, likely irritant plant related.  Will treat with prednisone taper, Benadryl as needed for itching        FINAL CLINICAL IMPRESSION(S) / ED DIAGNOSES   Final diagnoses:  Irritant contact dermatitis due to plants, except food     Rx / DC Orders   ED Discharge Orders          Ordered    predniSONE (DELTASONE) 10 MG tablet  Daily        02/01/23 1114             Note:  This document was prepared using Dragon voice recognition software and may include unintentional dictation errors.   Jene Every, MD 02/01/23 1134

## 2023-02-01 NOTE — ED Notes (Signed)
See triage notes. Patient stated she was mowing the grass the other day. That night, she began itching and woke up the next day with a rash in multiple spots over her body. Patient has tried multiple different remedies to help

## 2023-06-03 ENCOUNTER — Emergency Department: Payer: Medicaid Other

## 2023-06-03 ENCOUNTER — Emergency Department
Admission: EM | Admit: 2023-06-03 | Discharge: 2023-06-03 | Disposition: A | Discharge status: Home / Self Care | Payer: Medicaid Other | Attending: Emergency Medicine | Admitting: Emergency Medicine

## 2023-06-03 ENCOUNTER — Other Ambulatory Visit: Payer: Self-pay

## 2023-06-03 DIAGNOSIS — G43909 Migraine, unspecified, not intractable, without status migrainosus: Secondary | ICD-10-CM | POA: Diagnosis not present

## 2023-06-03 DIAGNOSIS — R519 Headache, unspecified: Secondary | ICD-10-CM | POA: Diagnosis present

## 2023-06-03 LAB — CBC WITH DIFFERENTIAL/PLATELET
Abs Immature Granulocytes: 0.08 10*3/uL — ABNORMAL HIGH (ref 0.00–0.07)
Basophils Absolute: 0 10*3/uL (ref 0.0–0.1)
Basophils Relative: 0 %
Eosinophils Absolute: 0 10*3/uL (ref 0.0–0.5)
Eosinophils Relative: 0 %
HCT: 42.8 % (ref 36.0–46.0)
Hemoglobin: 14.4 g/dL (ref 12.0–15.0)
Immature Granulocytes: 1 %
Lymphocytes Relative: 12 %
Lymphs Abs: 1.8 10*3/uL (ref 0.7–4.0)
MCH: 30.9 pg (ref 26.0–34.0)
MCHC: 33.6 g/dL (ref 30.0–36.0)
MCV: 91.8 fL (ref 80.0–100.0)
Monocytes Absolute: 0.8 10*3/uL (ref 0.1–1.0)
Monocytes Relative: 5 %
Neutro Abs: 12.2 10*3/uL — ABNORMAL HIGH (ref 1.7–7.7)
Neutrophils Relative %: 82 %
Platelets: 317 10*3/uL (ref 150–400)
RBC: 4.66 MIL/uL (ref 3.87–5.11)
RDW: 12.4 % (ref 11.5–15.5)
WBC: 15 10*3/uL — ABNORMAL HIGH (ref 4.0–10.5)
nRBC: 0 % (ref 0.0–0.2)

## 2023-06-03 LAB — BASIC METABOLIC PANEL
Anion gap: 11 (ref 5–15)
BUN: 12 mg/dL (ref 6–20)
CO2: 18 mmol/L — ABNORMAL LOW (ref 22–32)
Calcium: 9.1 mg/dL (ref 8.9–10.3)
Chloride: 107 mmol/L (ref 98–111)
Creatinine, Ser: 0.54 mg/dL (ref 0.44–1.00)
GFR, Estimated: >60 mL/min (ref >60)
Glucose, Bld: 119 mg/dL — ABNORMAL HIGH (ref 70–99)
Potassium: 3.9 mmol/L (ref 3.5–5.1)
Sodium: 136 mmol/L (ref 135–145)

## 2023-06-03 LAB — HCG, QUANTITATIVE, PREGNANCY: hCG, Beta Chain, Quant, S: 1 m[IU]/mL (ref <5)

## 2023-06-03 MED ORDER — BUTALBITAL-APAP-CAFFEINE 50-325-40 MG PO TABS: 1.0000 | ORAL_TABLET | Freq: Four times a day (QID) | ORAL | 0 refills | Status: AC | PRN

## 2023-06-03 MED ORDER — SODIUM CHLORIDE 0.9 % IV BOLUS
500.0000 mL | Freq: Once | INTRAVENOUS | Status: AC
  Administered 2023-06-03: 500 mL via INTRAVENOUS

## 2023-06-03 MED ORDER — DIPHENHYDRAMINE HCL 50 MG/ML IJ SOLN
50.0000 mg | Freq: Once | INTRAMUSCULAR | Status: AC
  Administered 2023-06-03: 50 mg via INTRAVENOUS
  Filled 2023-06-03: qty 1

## 2023-06-03 MED ORDER — KETOROLAC TROMETHAMINE 30 MG/ML IJ SOLN
30.0000 mg | Freq: Once | INTRAMUSCULAR | Status: AC
  Administered 2023-06-03: 30 mg via INTRAVENOUS
  Filled 2023-06-03: qty 1

## 2023-06-03 MED ORDER — METOCLOPRAMIDE HCL 5 MG/ML IJ SOLN
10.0000 mg | Freq: Once | INTRAMUSCULAR | Status: AC
  Administered 2023-06-03: 10 mg via INTRAVENOUS
  Filled 2023-06-03: qty 2

## 2023-06-03 NOTE — ED Triage Notes (Signed)
Pt reports aprox 1 month ago she began to have headaches every day, pt was seen by PCP Monday and was prescribed some migraine medications that have provided her no relief. Pt reports she is having some light sensitivity as well.

## 2023-06-03 NOTE — ED Notes (Signed)
See triage notes. Patient c/o headache times one month. Patient has been seen by her PCP and was given medication for sinuses. Patient stated she has a lot of pressure in her head.

## 2023-06-03 NOTE — ED Provider Notes (Signed)
[facility] Provider Note    Event Date/Time   First MD Initiated Contact with Patient 06/03/23 0715     (approximate)  History   Chief Complaint: Migraine  HPI  Katherine Fitzgerald is a 32 y.o. female with a past medical history of migraines who presents to the emergency department for headache.  According to the patient she has a history of migraines however they usually respond to over-the-counter medications.  She states for the past 1 month she has been experiencing daily headaches that have not been responding to over-the-counter medications.  Patient states the headache will diminish after taking medication but it does not completely go away and it will come back again the following day.  Patient saw her PCP who put her on Topamax and she states minimal relief with this medication.  Patient denies any fever denies any weakness or numbness of any arm or leg.  States moderate headache currently.  States it is worse with loud noises or bright lights.  Physical Exam   Triage Vital Signs: ED Triage Vitals  Encounter Vitals Group     BP 06/03/23 0552 115/88     Systolic BP Percentile --      Diastolic BP Percentile --      Pulse Rate 06/03/23 0552 70     Resp 06/03/23 0552 16     Temp 06/03/23 0552 98.4 F (36.9 C)     Temp src --      SpO2 06/03/23 0552 98 %     Weight 06/03/23 0551 171 lb (77.6 kg)     Height 06/03/23 0551 5\' 2"  (1.575 m)     Head Circumference --      Peak Flow --      Pain Score 06/03/23 0551 8     Pain Loc --      Pain Education --      Exclude from Growth Chart --     Most recent vital signs: Vitals:   06/03/23 0552  BP: 115/88  Pulse: 70  Resp: 16  Temp: 98.4 F (36.9 C)  SpO2: 98%    General: Awake, no distress.  CV:  Good peripheral perfusion.  Regular rate and rhythm  Resp:  Normal effort.  Equal breath sounds bilaterally.  Abd:  No distention.   ED Results / Procedures / Treatments   RADIOLOGY  CT viewed  and interpreted by myself does not appear to show any bleed or significant sinus disease. Radiology is read the CT scan is negative  MEDICATIONS ORDERED IN ED: Medications  ketorolac (TORADOL) 30 MG/ML injection 30 mg (has no administration in time range)  metoCLOPramide (REGLAN) injection 10 mg (has no administration in time range)  diphenhydrAMINE (BENADRYL) injection 50 mg (has no administration in time range)  sodium chloride 0.9 % bolus 500 mL (has no administration in time range)     IMPRESSION / MDM / ASSESSMENT AND PLAN / ED COURSE  I reviewed the triage vital signs and the nursing notes.  Patient's presentation is most consistent with acute presentation with potential threat to life or bodily function.  Patient presents to the emergency department for worsening migraine headaches over the past 1 month.  Overall the patient appears well, reassuring vital signs, reassuring physical exam.  Patient states she saw her doctor for the same who started her on Topamax and told her this is likely due to sinus infection.  Patient denies any cough congestion or fever.  No neurologic symptoms.  However given the worsening headache which is atypical for the patient of this duration at least, we will obtain CT imaging of the head as a precaution.  CT scan of the head shows no acute finding.  Lab work largely nonrevealing slight leukocytosis 15,000 otherwise reassuring CBC, reassuring chemistry negative pregnancy test.  We will dose migraine cocktail of medications and reassess.  I discussed with the patient the need to follow-up with neurology for ongoing headache treatment as she may need to be on more advanced medications.  Patient's head CT is negative for acute abnormality.  We will discharge with Fioricet and neurology follow-up.  Patient agreeable to plan.  States her headache is nearly gone.  FINAL CLINICAL IMPRESSION(S) / ED DIAGNOSES   Migraine headache    Note:  This document was  prepared using Dragon voice recognition software and may include unintentional dictation errors.   Minna Antis, MD 06/03/23 [phone]

## 2023-12-11 ENCOUNTER — Emergency Department

## 2023-12-11 ENCOUNTER — Other Ambulatory Visit: Payer: Self-pay

## 2023-12-11 ENCOUNTER — Emergency Department
Admission: EM | Admit: 2023-12-11 | Discharge: 2023-12-11 | Disposition: A | Discharge status: Home / Self Care | Attending: Emergency Medicine | Admitting: Emergency Medicine

## 2023-12-11 DIAGNOSIS — J069 Acute upper respiratory infection, unspecified: Secondary | ICD-10-CM | POA: Diagnosis not present

## 2023-12-11 DIAGNOSIS — R059 Cough, unspecified: Secondary | ICD-10-CM | POA: Diagnosis present

## 2023-12-11 MED ORDER — IPRATROPIUM-ALBUTEROL 0.5-2.5 (3) MG/3ML IN SOLN
3.0000 mL | Freq: Once | RESPIRATORY_TRACT | Status: AC
  Administered 2023-12-11: 3 mL via RESPIRATORY_TRACT
  Filled 2023-12-11: qty 3

## 2023-12-11 MED ORDER — VENTOLIN HFA 108 (90 BASE) MCG/ACT IN AERS: 1.0000 | INHALATION_SPRAY | RESPIRATORY_TRACT | 2 refills | Status: AC | PRN

## 2023-12-11 NOTE — ED Notes (Signed)
 87 yof with a c/c of a cough for the past few days. The pt is alert and oriented x 4. The pt is warm, pink, and dry. The pt is able to talk in full sentences without distress. The pt advised her son is starting to have symptoms as well. Call bell left at bedside.

## 2023-12-11 NOTE — ED Triage Notes (Signed)
 Worsening cough x 1 week.  Coughing up some mucous. Seen through [facility] 2 days ago for same.  Fever started yesterday. Taking mucinex and Tylenol .  Last taken tylenol  at 0600.

## 2023-12-11 NOTE — ED Provider Notes (Signed)
 [facility] Provider Note    Event Date/Time   First MD Initiated Contact with Patient 12/11/23 1321     (approximate)   History   Cough   HPI  Katherine Fitzgerald is a 33 y.o. female with a history of tobacco use since she was 33 years old who presents today for evaluation of cough.  Patient reports that she was seen at [facility] 2 days ago and was told that she has a viral infection.  She feels that her symptoms are worsening.  Now she has a runny nose as well.  There are no active problems to display for this patient.         Physical Exam   Triage Vital Signs: ED Triage Vitals  Encounter Vitals Group     BP 12/11/23 1253 113/86     Systolic BP Percentile --      Diastolic BP Percentile --      Pulse Rate 12/11/23 1253 79     Resp 12/11/23 1253 18     Temp 12/11/23 1253 97.8 F (36.6 C)     Temp Source 12/11/23 1253 Oral     SpO2 12/11/23 1253 100 %     Weight 12/11/23 1251 171 lb 1.2 oz (77.6 kg)     Height --      Head Circumference --      Peak Flow --      Pain Score --      Pain Loc --      Pain Education --      Exclude from Growth Chart --     Most recent vital signs: Vitals:   12/11/23 1253  BP: 113/86  Pulse: 79  Resp: 18  Temp: 97.8 F (36.6 C)  SpO2: 100%    Physical Exam Vitals and nursing note reviewed.  Constitutional:      General: Awake and alert. No acute distress.    Appearance: Normal appearance. The patient is normal weight.  HENT:     Head: Normocephalic and atraumatic.     Mouth: Mucous membranes are moist.  Eyes:     General: PERRL. Normal EOMs        Right eye: No discharge.        Left eye: No discharge.     Conjunctiva/sclera: Conjunctivae normal.  Cardiovascular:     Rate and Rhythm: Normal rate and regular rhythm.     Pulses: Normal pulses.  Pulmonary:     Effort: Pulmonary effort is normal. No respiratory distress.     Breath sounds: Expiratory wheezes bilaterally  Abdominal:     Abdomen  is soft. There is no abdominal tenderness. No rebound or guarding. No distention. Musculoskeletal:        General: No swelling. Normal range of motion.     Cervical back: Normal range of motion and neck supple.  Skin:    General: Skin is warm and dry.     Capillary Refill: Capillary refill takes less than 2 seconds.     Findings: No rash.  Neurological:     Mental Status: The patient is awake and alert.      ED Results / Procedures / Treatments   Labs (all labs ordered are listed, but only abnormal results are displayed) Labs Reviewed - No data to display   EKG     RADIOLOGY I independently reviewed and interpreted imaging and agree with radiologists findings.     PROCEDURES:  Critical Care performed:  Procedures   MEDICATIONS ORDERED IN ED: Medications  ipratropium-albuterol  (DUONEB) 0.5-2.5 (3) MG/3ML nebulizer solution 3 mL (3 mLs Nebulization Given 12/11/23 1347)     IMPRESSION / MDM / ASSESSMENT AND PLAN / ED COURSE  I reviewed the triage vital signs and the nursing notes.   Differential diagnosis includes, but is not limited to, viral URI, bronchitis, pneumonia.  I reviewed the patient's chart.  She was seen at [facility] yesterday and had a negative COVID/flu/RSV swab and a normal chest x-ray and EKG.  Patient symptoms are most consistent with a viral etiology.  She is hemodynamically stable and afebrile with a normal oxygen saturation of 100% on room air.  She was given a DuoNeb for reversal of her bronchospasm with improvement of her symptoms.  We discussed symptomatic management.  We also discussed return precautions.  Patient understands and agrees with plan.  She was discharged in stable condition.   Patient's presentation is most consistent with acute presentation with potential threat to life or bodily function.    FINAL CLINICAL IMPRESSION(S) / ED DIAGNOSES   Final diagnoses:  Viral URI with cough     Rx / DC Orders   ED Discharge Orders           Ordered    albuterol  (VENTOLIN  HFA) 108 (90 Base) MCG/ACT inhaler  Every 4 hours PRN        12/11/23 1421             Note:  This document was prepared using Dragon voice recognition software and may include unintentional dictation errors.   Abraham Entwistle E, PA-C 12/11/23 1438    Kandee Orion, MD 12/11/23 [phone]

## 2023-12-11 NOTE — Discharge Instructions (Addendum)
 You may use the inhaler as prescribed.  Please follow-up with your outpatient provider.  Please return for any new, worsening, or change in symptoms or other concerns.  It was a pleasure caring for you today.

## 2023-12-19 HISTORY — PX: WISDOM TOOTH EXTRACTION: SHX21

## 2024-02-02 ENCOUNTER — Other Ambulatory Visit: Payer: Self-pay

## 2024-02-02 ENCOUNTER — Emergency Department
Admission: EM | Admit: 2024-02-02 | Discharge: 2024-02-02 | Disposition: A | Discharge status: Home / Self Care | Source: Ambulatory Visit | Attending: Emergency Medicine | Admitting: Emergency Medicine

## 2024-02-02 DIAGNOSIS — R1011 Right upper quadrant pain: Secondary | ICD-10-CM | POA: Insufficient documentation

## 2024-02-02 DIAGNOSIS — R748 Abnormal levels of other serum enzymes: Secondary | ICD-10-CM | POA: Insufficient documentation

## 2024-02-02 DIAGNOSIS — F1090 Alcohol use, unspecified, uncomplicated: Secondary | ICD-10-CM | POA: Diagnosis not present

## 2024-02-02 DIAGNOSIS — R112 Nausea with vomiting, unspecified: Secondary | ICD-10-CM | POA: Insufficient documentation

## 2024-02-02 LAB — CBC
HCT: 39.8 % (ref 36.0–46.0)
Hemoglobin: 12.5 g/dL (ref 12.0–15.0)
MCH: 29.9 pg (ref 26.0–34.0)
MCHC: 31.4 g/dL (ref 30.0–36.0)
MCV: 95.2 fL (ref 80.0–100.0)
Platelets: 193 K/uL (ref 150–400)
RBC: 4.18 MIL/uL (ref 3.87–5.11)
RDW: 13.5 % (ref 11.5–15.5)
WBC: 7.9 K/uL (ref 4.0–10.5)
nRBC: 0 % (ref 0.0–0.2)

## 2024-02-02 LAB — COMPREHENSIVE METABOLIC PANEL WITH GFR
ALT: 17 U/L (ref 0–44)
AST: 22 U/L (ref 15–41)
Albumin: 3.5 g/dL (ref 3.5–5.0)
Alkaline Phosphatase: 67 U/L (ref 38–126)
Anion gap: 6 (ref 5–15)
BUN: 9 mg/dL (ref 6–20)
CO2: 22 mmol/L (ref 22–32)
Calcium: 8.5 mg/dL — ABNORMAL LOW (ref 8.9–10.3)
Chloride: 109 mmol/L (ref 98–111)
Creatinine, Ser: 0.77 mg/dL (ref 0.44–1.00)
GFR, Estimated: >60 mL/min (ref >60)
Glucose, Bld: 94 mg/dL (ref 70–99)
Potassium: 3.8 mmol/L (ref 3.5–5.1)
Sodium: 137 mmol/L (ref 135–145)
Total Bilirubin: 0.4 mg/dL (ref 0.0–1.2)
Total Protein: 6.8 g/dL (ref 6.5–8.1)

## 2024-02-02 LAB — LIPASE, BLOOD: Lipase: 52 U/L — ABNORMAL HIGH (ref 11–51)

## 2024-02-02 NOTE — ED Triage Notes (Addendum)
 Pt to ED via POV from [facility]. Pt reports RLQ pain since Sunday pt reports pain is now on left side as well. Pt reports pain has been constant and getting worse. Pt reports N/V.

## 2024-02-02 NOTE — Discharge Instructions (Signed)
 Rehydrate with Pedialyte, low sugar Gatorade and water.  Return to the ER for symptoms that change or worsen if unable to see primary care.  Avoid alcohol and Tylenol  as we discussed.

## 2024-02-02 NOTE — ED Provider Notes (Signed)
 [facility] Provider Note    Event Date/Time   First MD Initiated Contact with Patient 02/02/24 1516     (approximate)   History   Abdominal Pain   HPI  Katherine Fitzgerald is a 33 y.o. female  with history of migraine and as listed in EMR presents to the emergency department for treatment and evaluation of right upper quadrant abdominal pain x 3 days. No change in bowel habits. Also has nausea and vomiting. No fever. RUQ is persistent. No change with food or movement. No previous abdominal surgery. No similar symptoms in the past. No alleviating measures prior to arrival. She reports she drank a lot of alcohol over the weekend.     Physical Exam    Vitals:   02/02/24 1226  BP: (!) 135/95  Pulse: 68  Resp: 18  Temp: 98.9 F (37.2 C)  SpO2: 98%    General: Awake, no distress.  CV:  Good peripheral perfusion.  Resp:  Normal effort.  Abd:  No distention. Diffuse tenderness to palpation Other:     ED Results / Procedures / Treatments   Labs (all labs ordered are listed, but only abnormal results are displayed)  Labs Reviewed  LIPASE, BLOOD - Abnormal; Notable for the following components:      Result Value   Lipase 52 (*)    All other components within normal limits  COMPREHENSIVE METABOLIC PANEL WITH GFR - Abnormal; Notable for the following components:   Calcium 8.5 (*)    All other components within normal limits  CBC     EKG  Not indicated.   RADIOLOGY  Image and radiology report reviewed and interpreted by me. Radiology report consistent with the same.  Patient declined  PROCEDURES:  Critical Care performed: No  Procedures   MEDICATIONS ORDERED IN ED:  Medications - No data to display   IMPRESSION / MDM / ASSESSMENT AND PLAN / ED COURSE   I have reviewed the triage note and vital signs. Vital signs stable.   Differential diagnosis includes, but is not limited to, cholecystitis, cholelithiasis, pancreatitis,  gastroenteritis, appendicitis  Patient's presentation is most consistent with acute illness / injury with system symptoms.  33 year old female presenting to the emergency department for treatment evaluation of abdominal pain with nausea and vomiting.  See HPI for further details.  On exam, there is no focal area of abdominal tenderness. She has also had a single episode of vomiting.   Labs today show a normal CBC.  CMP reassuring as well.  Lipase is slightly elevated at 52.  Results were discussed with the patient.  She reports that she had a large quantity of alcohol over the weekend just before her symptoms started.  Her symptoms may be related to early pancreatitis.  Plan was to start fluids and get imaging of the abdomen.  Patient would prefer to hydrate at home and declines further imaging or testing.  This seems reasonable as she has no indication of dehydration and no leukocytosis.  She was advised that she may return to the emergency department if her abdominal pain becomes worse or she begins to have recurrent nausea and vomiting.      FINAL CLINICAL IMPRESSION(S) / ED DIAGNOSES   Final diagnoses:  Elevated lipase  Alcohol use     Rx / DC Orders   ED Discharge Orders     None        Note:  This document was prepared using Dragon voice recognition  software and may include unintentional dictation errors.   Herlinda Kirk NOVAK, FNP 02/02/24 2350    Viviann Pastor, MD 02/03/24 2253

## 2024-06-29 ENCOUNTER — Encounter: Payer: Self-pay | Admitting: Family Medicine

## 2024-06-29 ENCOUNTER — Ambulatory Visit (LOCAL_COMMUNITY_HEALTH_CENTER): Payer: Self-pay | Admitting: Family Medicine

## 2024-06-29 VITALS — BP 119/72 | HR 78 | Ht 62.0 in | Wt 161.2 lb

## 2024-06-29 DIAGNOSIS — Z3042 Encounter for surveillance of injectable contraceptive: Secondary | ICD-10-CM | POA: Insufficient documentation

## 2024-06-29 DIAGNOSIS — Z124 Encounter for screening for malignant neoplasm of cervix: Secondary | ICD-10-CM | POA: Insufficient documentation

## 2024-06-29 DIAGNOSIS — Z113 Encounter for screening for infections with a predominantly sexual mode of transmission: Secondary | ICD-10-CM

## 2024-06-29 DIAGNOSIS — N96 Recurrent pregnancy loss: Secondary | ICD-10-CM

## 2024-06-29 DIAGNOSIS — Z91030 Bee allergy status: Secondary | ICD-10-CM | POA: Insufficient documentation

## 2024-06-29 DIAGNOSIS — Z30013 Encounter for initial prescription of injectable contraceptive: Secondary | ICD-10-CM

## 2024-06-29 DIAGNOSIS — Z3009 Encounter for other general counseling and advice on contraception: Secondary | ICD-10-CM

## 2024-06-29 DIAGNOSIS — Z3202 Encounter for pregnancy test, result negative: Secondary | ICD-10-CM

## 2024-06-29 LAB — HM HIV SCREENING LAB: HM HIV Screening: NEGATIVE

## 2024-06-29 LAB — WET PREP FOR TRICH, YEAST, CLUE
Clue Cell Exam: NEGATIVE
Trichomonas Exam: NEGATIVE
Yeast Exam: NEGATIVE

## 2024-06-29 LAB — PREGNANCY, URINE: Preg Test, Ur: NEGATIVE

## 2024-06-29 LAB — HM HEPATITIS C SCREENING LAB: HM Hepatitis Screen: NEGATIVE

## 2024-06-29 MED ORDER — EPINEPHRINE 0.3 MG/0.3ML IJ SOAJ: 0.3000 mg | INTRAMUSCULAR | 3 refills | Status: AC | PRN

## 2024-06-29 MED ORDER — MEDROXYPROGESTERONE ACETATE 150 MG/ML IM SUSP
150.0000 mg | INTRAMUSCULAR | Status: AC
  Administered 2024-06-29: 150 mg via INTRAMUSCULAR

## 2024-06-29 NOTE — Progress Notes (Signed)
 Pt is here PE. Wet Prep results reviewed with pt and requires no treatment. Depo IM injection given to pt at the Rt Deltoid and pt tolerated well to the injection with no complications. Condoms declined and reminder card given. Opportunity given to Patient to ask questions for any clarifications, questions answered. Wilkie Drought, RN.

## 2024-06-29 NOTE — Assessment & Plan Note (Signed)
 Politely declines PAP smear today, states she gets them through her OBGYN at [facility].

## 2024-06-29 NOTE — Progress Notes (Signed)
 [facility] [address] [place] [address] Main phone: [phone]  Family Planning Visit - Initial Visit  Subjective:  Katherine Fitzgerald is a 33 y.o.  [id number]   being seen today for an initial annual visit and to discuss reproductive life planning.  The patient is currently using no method - no contraceptive precautions for pregnancy prevention. Patient feels neutral about a pregnancy in the next year.   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 No known exposures to STI. Has been going through a divorce, but has a new partner of 6 months. She would like to have another pregnancy in the future, but reports she does not want pregnancy right now. She would like to get through her divorce and then develop her new relationship before her next child. 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   LMP 06/10/2024, was lighter than usual.  Last sex this morning. Estimated 20 or more episodes of intercourse since LMP.   Patient denies rashes, sores, lesions, and lymphadenopathy.    Review of Systems  Constitutional:  Negative for fever, malaise/fatigue and weight loss.  Respiratory:  Negative for shortness of breath.   Cardiovascular:  Negative for chest pain and palpitations.   Diabetes screening This patient is 33 y.o. with a BMI of Body  mass index is 29.48 kg/m.SABRA  Is patient eligible for diabetes screening (age >35 and BMI >25)?  no  Was Hgb A1c ordered? not applicable  STI screening Patient reports 2 of partners in last year.  Does this patient desire STI screening?  Yes  Hepatitis C screening Has patient been screened once for HCV in the past?  No  No results found for: HCVAB  Does the patient meet criteria for HCV testing? Yes   Hepatitis B screening Does the patient meet criteria for HBV testing? Yes  Cervical Cancer Screening  May be due for a PAP, but she reports she gets these done through her OBGYN Dr[facility] Janit at [facility]. She politely declines one today.  Result Date Procedure Results Follow-ups  10/29/2020 Cytology - PAP Adequacy: Satisfactory for evaluation; transformation zone component PRESENT. Diagnosis: - Negative for intraepithelial lesion or malignancy (NILM)   01/25/2015 HM PAP SMEAR HM Pap smear: [facility], [facility]    Health Maintenance Due  Topic Date Due   HPV VACCINES (2 - 3-dose series) 10/19/2007   Hepatitis C Screening  Never done   Pneumococcal Vaccine (1 of 2 - PCV) Never done   Cervical Cancer Screening (HPV/Pap Cotest)  10/30/2023   Influenza Vaccine  Never done   COVID-19 Vaccine (1 - 2025-26 season) Never done   The following portions of the patient's history were reviewed and updated as appropriate: allergies, current medications, past family history, past medical history, past social history, past surgical history and problem list. Problem list updated.  See flowsheet for further details and programmatic requirements Hyperlink available at the top of  the signed note in blue.  Flow sheet content below:  Pregnancy Intention Screening Does the patient want to become pregnant in the next year?: Unsure Does the patient's partner want to become pregnant in the next year?: Unsure Would the patient like to discuss contraceptive options today?: Yes Other:  Password: [email or web address] Contraception  History Past methods of contraception used by patient:: Hormonal Injection Adverse effects associated with Hormonal Injection: None Sexual History What age did you start your period?: 12 How often do you have your period?: monthly Date of last sex?: 06/29/24 Has the patient had unprotected sex within the last 5 days?: Yes Do you have sex with men, women, both men and women?: Men only In the past 2 months how many partners have you had sex with?: 1 In the past 12 months, how many partners have you had sex with?: 2 Is it possible that any of your sex partners in the past 12 months had sex with someone else whild they were still in a sexual relationship with you?: No What ways do you have sex?: Vaginal, Oral, Anal Do you or your partner use condoms and/or dental dams every time you have vaginal, oral or anal sex?: No Do you douche?: No Date of last HIV test?: 11/25/22 Have you ever had an STD?: Yes Have any of your partners had an STD?: Yes 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 Date?:  (18 years ago) Have you or your partner ever shot up drugs?: No Have any of your partners used drugs in the past?: No Have you or your partners exchanged money or drugs for sex?: No Risk Factors for Hep B Household, sexual, or needle sharing contact of a person infected with Hep B: No Sexual contact with a person who uses drugs not as prescribed?: Yes Currently or Ever used drugs not as prescribed: Yes HIV Positive: No PRep Patient: No Men who have sex with men: N/A Have Hepatitis C: No History of Incarceration: No History of Homeslessness?: No Anal sex following anal drug use?: No Risk Factors for Hep C Currently using drugs not as prescribed: Yes Sexual partner(s) currently using drugs as not prescribed: Yes History of drug use: No HIV Positive: No People with a history of incarceration: No People born between the years of 82 and 89: No Hepatitis Counseling Hep B Counseling: Counseled patient about  increased risk of Hep B and recommendation for testing, Patient accepts testing for Hep B today Hep C Counseling: Counseled patient about increased risk of Hep C and recommendation for testing, Patient accepts testing for Hep C today Advise Advised client to quit or stay quit. : Yes Assess Is patient ready to quit in next 30 days?: No Counseling All Patients: Use specific methods of contraceoptive and identify adverse effects (R), Typical use rates for method effectiveness (R) Education: Make informed decision about family planning, Provided preconception counseling, Reduce risk of transmission and protection from STD's and HIV, Understand BMI >25 or >18.5 is a health risk (weight management educational materials to be provided to client requests), How to use the method consistently and correctly, Teach back method completed, Warning signs for rare but serious adverse events and what to do if they experience a warning sign (including emergency 24 hour number, where to seek emergency service outside of hours of operation), When to return for follow up (planned return schedule) Contraception Wrap Up Current Method: No Contraceptive Precautions End Method: Hormonal Injection Contraception Counseling Provided: Yes How was the end contraceptive method provided?: Provided on site  Objective:   Vitals:   06/29/24 1324  BP: 119/72  Pulse: 78  Weight: 161 lb 3.2 oz (73.1 kg)  Height: 5' 2 (1.575 m)   Physical Exam Vitals and nursing note reviewed.  Constitutional:      Appearance: Normal appearance.  HENT:     Head: Normocephalic and atraumatic.     Comments: No nits or hair loss on scalp, brows, and lashes    Mouth/Throat:     Mouth: Mucous membranes are moist.     Pharynx: Oropharynx is clear. No oropharyngeal exudate or posterior oropharyngeal erythema.  Eyes:     General: No scleral icterus.       Right eye: No discharge.        Left eye: No discharge.     Conjunctiva/sclera:  Conjunctivae normal.     Right eye: Right conjunctiva is not injected.     Left eye: Left conjunctiva is not injected.  Pulmonary:     Effort: Pulmonary effort is normal.  Abdominal:     Tenderness: There is no abdominal tenderness. There is no rebound.  Genitourinary:    Comments: Politely declined genital exam. Musculoskeletal:     Cervical back: Neck supple. No rigidity or tenderness.  Lymphadenopathy:     Cervical: No cervical adenopathy.     Upper Body:     Right upper body: No supraclavicular adenopathy.     Left upper body: No supraclavicular adenopathy.     Comments: Patient declines pelvic exam, inguinal lymph nodes not evaluated.  Skin:    General: Skin is warm and dry.     Findings: No lesion or rash.  Neurological:     Mental Status: She is alert and oriented to person, place, and time.  Psychiatric:        Mood and Affect: Mood normal.        Behavior: Behavior normal.    Assessment and Plan:  Katherine Fitzgerald is a 33 y.o. female presenting to the [facility] Department for an initial annual wellness/contraceptive visit  Contraception counseling:  Reviewed options based on patient desire and reproductive life plan. Patient is interested in Hormonal Injection. This was provided today.   Risks, benefits, and typical effectiveness rates were reviewed.  Questions were answered.  Written information was also given to the patient to review.    The patient will follow up in  3 months for surveillance.  The patient was told to call with any further questions, or with any concerns about this method of contraception.  Emphasized use of condoms 100% of the time for STI prevention.  Emergency Contraception Precautions (ECP): Patient assessed for need of ECP. She is not a candidate based on desire for pregnancy.   Encounter for surveillance of injectable contraceptive Assessment & Plan: Initiating depo provera  today. Urine pregnancy negative. Recommend repeat  home or clinic UPT in 4 weeks.   Discussed her desire for more children and the length of return to fertility after depo.    Orders: -     medroxyPROGESTERone  Acetate  Screening for venereal disease -     Chlamydia/Gonorrhea [facility] -     Syphilis Serology, [facility] -     HIV/HCV [facility] -     HBV Antigen/Antib[facility] -     Chlamydia/Gonorrhea [facility] -     Chlamydia/Gonorrhea [facility] -     WET PREP FOR TRICH, YEAST, CLUE -     Pregnancy,  urine  History of recurrent miscarriages Assessment & Plan: Reports more than 3. Discussed returning to OBGYN to potentially start work up. Although did discuss that OB may want to wait until 6 months of trying for conception with new partner before referring to specialist.    Cervical cancer screening Assessment & Plan: Politely declines PAP smear today, states she gets them through her OBGYN at [facility].    Bee sting allergy -     EPINEPHrine ; Inject 0.3 mg into the muscle as needed for anaphylaxis. If EpiPen  is used, you MUST go to the hospital for monitoring afterwards.  Dispense: 2 each; Refill: 3 -     Ambulatory referral to Allergy   Return in about 1 year (around 06/29/2025) for annual well-woman exam.  No future appointments.  Betsey CHRISTELLA Helling, MD

## 2024-06-29 NOTE — Assessment & Plan Note (Signed)
 Initiating depo provera  today. Urine pregnancy negative. Recommend repeat home or clinic UPT in 4 weeks.   Discussed her desire for more children and the length of return to fertility after depo.

## 2024-06-29 NOTE — Assessment & Plan Note (Signed)
 Reports more than 3. Discussed returning to OBGYN to potentially start work up. Although did discuss that OB may want to wait until 6 months of trying for conception with new partner before referring to specialist.

## 2024-06-29 NOTE — Patient Instructions (Addendum)
 STI screening - Today we obtained a vaginal swab to screen for gonorrhea, chlamydia, and trichomonas, oral swab to screen for gonorrhea and chlamydia, and anal swab to screen for gonorrhea and chlamydia - We also obtained a blood sample to screen for HIV, syphilis, Hepatitis B, and Hepatitis C  - If the results are abnormal, I will give you a call.    Estimated time frame for results collected at the [facility] Department: Same day Trichomonas Yeast BV (bacterial vaginosis)  Within 3-5 days Gonorrhea Chlamydia  Within 1 week HIV Syphilis Hepatitis B Hepatitis C    Depo Provera  Today we gave you the injectable birth control called Depo Provera .  Please come back in 11-12 weeks for your next injection.   Smoking cessation resources Quit Smoking Hotline:  800-QUIT-NOW (199-215-1330)     EpiPen  See attached information.  Please go to the manufacturer's website for informational video on how to use your EpiPen .  https://www.youtube.com/@EpiPenOfficial /about If you use the EpiPen  for suspected allergic reaction, you MUST go to the hospital for evaluation afterwards.  I have referred you to an allergist for further work up of your allergy.

## 2024-07-05 LAB — HBV ANTIGEN/ANTIBODY STATE LAB
Hep B Core Total Ab: NONREACTIVE
Hep B S Ab: NONREACTIVE
Hepatitis B Surface Ag: NONREACTIVE

## 2024-07-19 ENCOUNTER — Emergency Department: Payer: Self-pay

## 2024-07-19 ENCOUNTER — Emergency Department
Admission: EM | Admit: 2024-07-19 | Discharge: 2024-07-19 | Disposition: A | Discharge status: Home / Self Care | Payer: Self-pay | Attending: Emergency Medicine | Admitting: Emergency Medicine

## 2024-07-19 ENCOUNTER — Other Ambulatory Visit: Payer: Self-pay

## 2024-07-19 ENCOUNTER — Encounter: Payer: Self-pay | Admitting: Emergency Medicine

## 2024-07-19 DIAGNOSIS — R10A1 Flank pain, right side: Secondary | ICD-10-CM | POA: Insufficient documentation

## 2024-07-19 LAB — COMPREHENSIVE METABOLIC PANEL WITH GFR
ALT: 8 U/L (ref 0–44)
AST: 20 U/L (ref 15–41)
Albumin: 4.3 g/dL (ref 3.5–5.0)
Alkaline Phosphatase: 77 U/L (ref 38–126)
Anion gap: 12 (ref 5–15)
BUN: 15 mg/dL (ref 6–20)
CO2: 19 mmol/L — ABNORMAL LOW (ref 22–32)
Calcium: 8.9 mg/dL (ref 8.9–10.3)
Chloride: 105 mmol/L (ref 98–111)
Creatinine, Ser: 0.7 mg/dL (ref 0.44–1.00)
GFR, Estimated: >60 mL/min
Glucose, Bld: 100 mg/dL — ABNORMAL HIGH (ref 70–99)
Potassium: 4.4 mmol/L (ref 3.5–5.1)
Sodium: 136 mmol/L (ref 135–145)
Total Bilirubin: 0.3 mg/dL (ref 0.0–1.2)
Total Protein: 7.2 g/dL (ref 6.5–8.1)

## 2024-07-19 LAB — CBC WITH DIFFERENTIAL/PLATELET
Abs Immature Granulocytes: 0.03 K/uL (ref 0.00–0.07)
Basophils Absolute: 0.1 K/uL (ref 0.0–0.1)
Basophils Relative: 1 %
Eosinophils Absolute: 0.1 K/uL (ref 0.0–0.5)
Eosinophils Relative: 1 %
HCT: 41.8 % (ref 36.0–46.0)
Hemoglobin: 13.3 g/dL (ref 12.0–15.0)
Immature Granulocytes: 0 %
Lymphocytes Relative: 31 %
Lymphs Abs: 2.7 K/uL (ref 0.7–4.0)
MCH: 28.3 pg (ref 26.0–34.0)
MCHC: 31.8 g/dL (ref 30.0–36.0)
MCV: 88.9 fL (ref 80.0–100.0)
Monocytes Absolute: 0.8 K/uL (ref 0.1–1.0)
Monocytes Relative: 9 %
Neutro Abs: 5 K/uL (ref 1.7–7.7)
Neutrophils Relative %: 58 %
Platelets: 348 K/uL (ref 150–400)
RBC: 4.7 MIL/uL (ref 3.87–5.11)
RDW: 13.2 % (ref 11.5–15.5)
WBC: 8.7 K/uL (ref 4.0–10.5)
nRBC: 0 % (ref 0.0–0.2)

## 2024-07-19 LAB — URINALYSIS, ROUTINE W REFLEX MICROSCOPIC
Bacteria, UA: NONE SEEN
Bilirubin Urine: NEGATIVE
Glucose, UA: NEGATIVE mg/dL
Ketones, ur: NEGATIVE mg/dL
Leukocytes,Ua: NEGATIVE
Nitrite: NEGATIVE
Protein, ur: NEGATIVE mg/dL
Specific Gravity, Urine: 1.02 (ref 1.005–1.030)
pH: 6 (ref 5.0–8.0)

## 2024-07-19 LAB — LIPASE, BLOOD: Lipase: 32 U/L (ref 11–51)

## 2024-07-19 LAB — POC URINE PREG, ED: Preg Test, Ur: NEGATIVE

## 2024-07-19 MED ORDER — MORPHINE SULFATE (PF) 4 MG/ML IV SOLN
4.0000 mg | Freq: Once | INTRAVENOUS | Status: AC
  Administered 2024-07-19: 4 mg via INTRAVENOUS
  Filled 2024-07-19: qty 1

## 2024-07-19 MED ORDER — ONDANSETRON 4 MG PO TBDP: ORAL_TABLET | ORAL | 0 refills | Status: AC

## 2024-07-19 MED ORDER — ONDANSETRON HCL 4 MG/2ML IJ SOLN
4.0000 mg | INTRAMUSCULAR | Status: AC
  Administered 2024-07-19: 4 mg via INTRAVENOUS
  Filled 2024-07-19: qty 2

## 2024-07-19 MED ORDER — KETOROLAC TROMETHAMINE 30 MG/ML IJ SOLN
15.0000 mg | Freq: Once | INTRAMUSCULAR | Status: AC
  Administered 2024-07-19: 15 mg via INTRAVENOUS
  Filled 2024-07-19: qty 1

## 2024-07-19 MED ORDER — HYDROCODONE-ACETAMINOPHEN 5-325 MG PO TABS: 2.0000 | ORAL_TABLET | Freq: Four times a day (QID) | ORAL | 0 refills | Status: AC | PRN

## 2024-07-19 NOTE — Discharge Instructions (Signed)
 You have been seen in the Emergency Department (ED) today for pain caused by kidney stones (or more specifically, and one that you likely already passed).  As we have discussed, please drink plenty of fluids.  Please make a follow up appointment with the physician(s) listed elsewhere in this documentation.  You may take pain medication as needed but ONLY as prescribed.  Please also take your prescribed Flomax daily.  If you are going to follow up with Urology for possible lithotripsy, please do not take any ibuprofen , naproxen, aspirin, Toradol , or other NSAID after Tuesday morning, as this may exclude you from lithotripsy.  Please see your doctor as soon as possible as stones may take 1-3 weeks to pass and you may require additional care or medications.  Do not drink alcohol, drive or participate in any other potentially dangerous activities while taking opiate pain medication as it may make you sleepy. Do not take this medication with any other sedating medications, either prescription or over-the-counter. If you were prescribed Percocet or Vicodin, do not take these with acetaminophen  (Tylenol ) as it is already contained within these medications.   Take Norco as needed for severe pain.  This medication is an opiate (or narcotic) pain medication and can be habit forming.  Use it as little as possible to achieve adequate pain control.  Do not use or use it with extreme caution if you have a history of opiate abuse or dependence.  If you are on a pain contract with your primary care doctor or a pain specialist, be sure to let them know you were prescribed this medication today from the [facility] Emergency Department.  This medication is intended for your use only - do not give any to anyone else and keep it in a secure place where nobody else, especially children, have access to it.  It will also cause or worsen constipation, so you may want to consider taking an over-the-counter stool softener while  you are taking this medication.  Return to the Emergency Department (ED) or call your doctor if you have any worsening pain, fever, painful urination, are unable to urinate, or develop other symptoms that concern you.

## 2024-07-19 NOTE — ED Notes (Signed)
 Pt to CT

## 2024-07-19 NOTE — ED Triage Notes (Signed)
 Patient ambulatory to triage with steady gait, without difficulty or distress noted; pt reports since yesterday having rt flank pain radiating around into abd accomp by nausea and diarrhea; denies hx of same

## 2024-07-19 NOTE — ED Provider Notes (Signed)
 "  [facility] Provider Note    Event Date/Time   First MD Initiated Contact with Patient 07/19/24 [phone]     (approximate)   History   Flank Pain   HPI Katherine Fitzgerald is a 33 y.o. female with no chronic medical issues who presents for evaluation of acute onset sharp pain in the right lower part of her back which is now radiating around to the right front.  It is accompanied with nausea but no vomiting.  She has been having some loose stools recently but only intermittently.  No other abdominal pain.  She had a Depo shot a few weeks ago and a negative pregnancy test last week.  She denies fever, chest pain, shortness of breath.  No dysuria, increased urinary frequency, or prior history of kidney stones.     Physical Exam   Triage Vital Signs: ED Triage Vitals  Encounter Vitals Group     BP 07/19/24 0543 122/83     Girls Systolic BP Percentile --      Girls Diastolic BP Percentile --      Boys Systolic BP Percentile --      Boys Diastolic BP Percentile --      Pulse Rate 07/19/24 0543 81     Resp 07/19/24 0543 20     Temp 07/19/24 0543 98.5 F (36.9 C)     Temp src --      SpO2 07/19/24 0543 99 %     Weight 07/19/24 0539 72.6 kg (160 lb)     Height 07/19/24 0539 1.575 m (5' 2)     Head Circumference --      Peak Flow --      Pain Score 07/19/24 0539 6     Pain Loc --      Pain Education --      Exclude from Growth Chart --     Most recent vital signs: Vitals:   07/19/24 0543  BP: 122/83  Pulse: 81  Resp: 20  Temp: 98.5 F (36.9 C)  SpO2: 99%    General: Awake, no distress.  CV:  Good peripheral perfusion.  Regular rate and rhythm Resp:  Normal effort. Speaking easily and comfortably, no accessory muscle usage nor intercostal retractions.   Abd:  No distention.  Soft with no tenderness to palpation in the abdomen Other:  Patient has tenderness to percussion of the right flank   ED Results / Procedures / Treatments   Labs (all  labs ordered are listed, but only abnormal results are displayed) Labs Reviewed  COMPREHENSIVE METABOLIC PANEL WITH GFR - Abnormal; Notable for the following components:      Result Value   CO2 19 (*)    Glucose, Bld 100 (*)    All other components within normal limits  URINALYSIS, ROUTINE W REFLEX MICROSCOPIC - Abnormal; Notable for the following components:   Color, Urine YELLOW (*)    APPearance CLEAR (*)    Hgb urine dipstick SMALL (*)    All other components within normal limits  CBC WITH DIFFERENTIAL/PLATELET  LIPASE, BLOOD  POC URINE PREG, ED      RADIOLOGY See ED course for details   PROCEDURES:  Critical Care performed: No  Procedures    IMPRESSION / MDM / ASSESSMENT AND PLAN / ED COURSE  I reviewed the triage vital signs and the nursing notes.  Differential diagnosis includes, but is not limited to, kidney/ureteral stone, UTI/pyelonephritis, less likely STD/PID or musculoskeletal strain.  Patient's presentation is most consistent with acute presentation with potential threat to life or bodily function.  Labs/studies ordered: CT renal stone protocol, CMP, CBC with differential, urinalysis, urine pregnancy test, lipase  Interventions/Medications given:  Medications  ketorolac  (TORADOL ) 30 MG/ML injection 15 mg (15 mg Intravenous Given 07/19/24 0617)  morphine  (PF) 4 MG/ML injection 4 mg (4 mg Intravenous Given 07/19/24 0619)  ondansetron  (ZOFRAN ) injection 4 mg (4 mg Intravenous Given 07/19/24 0616)    (Note:  hospital course my include additional interventions and/or labs/studies not listed above.)   Patient's presentation is very consistent with ureterolithiasis.  I am proceeding with Toradol  15 mg IV, morphine  4 mg IV, Zofran  4 mg IV, and appropriate evaluation.  CBC is within normal limits and urine pregnancy test is negative.   Clinical Course as of 07/19/24 0717  Wed Jul 19, 2024  0711 Patient feeling better but not  completely asymptomatic.  I independently viewed and interpreted the patient's CT renal stone protocol.  I could not see any evidence of ureterolithiasis, but the radiologist mentioned a punctate stone within the left kidney.  Given the reassuring workup, I see no evidence of any emergent medical condition at this time.  She has a bit of hemoglobinuria but no gross blood and no infection.  I explained to her my theory that she had a small stone on the right that she passed and she is having some residual pain.  She is comfortable with the plan for discharge and outpatient follow-up with medications as listed below.  I gave my usual and customary return precautions.  The patient's medical screening exam is reassuring with no indication of an emergent medical condition requiring hospitalization or additional evaluation at this point.  The patient is safe and appropriate for discharge and outpatient follow up. [CF]    Clinical Course User Index [CF] Gordan Huxley, MD     FINAL CLINICAL IMPRESSION(S) / ED DIAGNOSES   Final diagnoses:  Right flank pain     Rx / DC Orders   ED Discharge Orders          Ordered    HYDROcodone -acetaminophen  (NORCO/VICODIN) 5-325 MG tablet  Every 6 hours PRN        07/19/24 0715    ondansetron  (ZOFRAN -ODT) 4 MG disintegrating tablet        07/19/24 0715             Note:  This document was prepared using Dragon voice recognition software and may include unintentional dictation errors.   Gordan Huxley, MD 07/19/24 [phone]  "
